# Patient Record
Sex: Male | Born: 1970 | Race: Black or African American | Hispanic: No | Marital: Single | Smoking: Current every day smoker
Health system: Southern US, Community
[De-identification: ages and names within clinical notes are randomized; demographics above are authoritative.]

## PROBLEM LIST (undated history)

## (undated) DIAGNOSIS — Z046 Encounter for general psychiatric examination, requested by authority: Secondary | ICD-10-CM

## (undated) DIAGNOSIS — L309 Dermatitis, unspecified: Secondary | ICD-10-CM

## (undated) DIAGNOSIS — M7020 Olecranon bursitis, unspecified elbow: Secondary | ICD-10-CM

## (undated) DIAGNOSIS — M1990 Unspecified osteoarthritis, unspecified site: Secondary | ICD-10-CM

## (undated) DIAGNOSIS — F1010 Alcohol abuse, uncomplicated: Secondary | ICD-10-CM

## (undated) DIAGNOSIS — Z720 Tobacco use: Secondary | ICD-10-CM

## (undated) DIAGNOSIS — E119 Type 2 diabetes mellitus without complications: Secondary | ICD-10-CM

## (undated) DIAGNOSIS — Z8659 Personal history of other mental and behavioral disorders: Secondary | ICD-10-CM

## (undated) DIAGNOSIS — T8859XA Other complications of anesthesia, initial encounter: Secondary | ICD-10-CM

## (undated) DIAGNOSIS — F1994 Other psychoactive substance use, unspecified with psychoactive substance-induced mood disorder: Secondary | ICD-10-CM

## (undated) DIAGNOSIS — I10 Essential (primary) hypertension: Secondary | ICD-10-CM

## (undated) DIAGNOSIS — K219 Gastro-esophageal reflux disease without esophagitis: Secondary | ICD-10-CM

## (undated) HISTORY — DX: Dermatitis, unspecified: L30.9

## (undated) HISTORY — PX: EXTERNAL EAR SURGERY: SHX627

## (undated) HISTORY — PX: HERNIA REPAIR: SHX51

---

## 2004-07-27 ENCOUNTER — Emergency Department: Payer: Self-pay | Admitting: Emergency Medicine

## 2004-09-25 ENCOUNTER — Emergency Department: Payer: Self-pay | Admitting: Emergency Medicine

## 2009-03-12 ENCOUNTER — Emergency Department: Payer: Self-pay | Admitting: Emergency Medicine

## 2013-07-23 ENCOUNTER — Emergency Department: Payer: Self-pay | Admitting: Emergency Medicine

## 2013-07-23 LAB — COMPREHENSIVE METABOLIC PANEL
Albumin: 4.4 g/dL (ref 3.4–5.0)
Alkaline Phosphatase: 71 U/L
BUN: 9 mg/dL (ref 7–18)
Bilirubin,Total: 0.2 mg/dL (ref 0.2–1.0)
EGFR (Non-African Amer.): >60
Osmolality: 283 (ref 275–301)
SGPT (ALT): 28 U/L (ref 12–78)
Total Protein: 8.7 g/dL — ABNORMAL HIGH (ref 6.4–8.2)

## 2013-07-23 LAB — CBC
HCT: 51 % (ref 40.0–52.0)
MCH: 35 pg — ABNORMAL HIGH (ref 26.0–34.0)
MCV: 103 fL — ABNORMAL HIGH (ref 80–100)
RBC: 4.98 10*6/uL (ref 4.40–5.90)

## 2013-07-23 LAB — ETHANOL
Ethanol %: 0.349 % (ref 0.000–0.080)
Ethanol: 349 mg/dL

## 2013-12-11 ENCOUNTER — Emergency Department: Payer: Self-pay | Admitting: Emergency Medicine

## 2013-12-11 LAB — URINALYSIS, COMPLETE
BACTERIA: NONE SEEN
BILIRUBIN, UR: NEGATIVE
BLOOD: NEGATIVE
GLUCOSE, UR: NEGATIVE mg/dL (ref 0–75)
KETONE: NEGATIVE
Nitrite: NEGATIVE
PH: 7 (ref 4.5–8.0)
Protein: NEGATIVE
SPECIFIC GRAVITY: 1.02 (ref 1.003–1.030)
Squamous Epithelial: <1
WBC UR: 14 /HPF (ref 0–5)

## 2014-05-09 ENCOUNTER — Emergency Department: Payer: Self-pay | Admitting: Emergency Medicine

## 2014-06-16 ENCOUNTER — Emergency Department: Payer: Self-pay | Admitting: Emergency Medicine

## 2014-06-27 ENCOUNTER — Emergency Department: Payer: Self-pay | Admitting: Emergency Medicine

## 2015-11-07 ENCOUNTER — Encounter: Payer: Self-pay | Admitting: Emergency Medicine

## 2015-11-07 ENCOUNTER — Emergency Department
Admission: EM | Admit: 2015-11-07 | Discharge: 2015-11-08 | Disposition: A | Discharge status: Home / Self Care | Payer: Self-pay | Attending: Emergency Medicine | Admitting: Emergency Medicine

## 2015-11-07 DIAGNOSIS — F17210 Nicotine dependence, cigarettes, uncomplicated: Secondary | ICD-10-CM | POA: Insufficient documentation

## 2015-11-07 DIAGNOSIS — R4781 Slurred speech: Secondary | ICD-10-CM | POA: Insufficient documentation

## 2015-11-07 DIAGNOSIS — F1010 Alcohol abuse, uncomplicated: Secondary | ICD-10-CM

## 2015-11-07 DIAGNOSIS — F4325 Adjustment disorder with mixed disturbance of emotions and conduct: Secondary | ICD-10-CM

## 2015-11-07 DIAGNOSIS — R45851 Suicidal ideations: Secondary | ICD-10-CM | POA: Insufficient documentation

## 2015-11-07 DIAGNOSIS — F1994 Other psychoactive substance use, unspecified with psychoactive substance-induced mood disorder: Secondary | ICD-10-CM

## 2015-11-07 LAB — COMPREHENSIVE METABOLIC PANEL
ALBUMIN: 4.2 g/dL (ref 3.5–5.0)
ALK PHOS: 50 U/L (ref 38–126)
ALT: 22 U/L (ref 17–63)
ANION GAP: 10 (ref 5–15)
AST: 36 U/L (ref 15–41)
BILIRUBIN TOTAL: 0.4 mg/dL (ref 0.3–1.2)
BUN: 10 mg/dL (ref 6–20)
CALCIUM: 8.7 mg/dL — AB (ref 8.9–10.3)
CO2: 22 mmol/L (ref 22–32)
CREATININE: 0.91 mg/dL (ref 0.61–1.24)
Chloride: 108 mmol/L (ref 101–111)
GFR calc Af Amer: >60 mL/min (ref >60)
GFR calc non Af Amer: >60 mL/min (ref >60)
GLUCOSE: 72 mg/dL (ref 65–99)
Potassium: 4 mmol/L (ref 3.5–5.1)
SODIUM: 140 mmol/L (ref 135–145)
TOTAL PROTEIN: 8 g/dL (ref 6.5–8.1)

## 2015-11-07 LAB — URINALYSIS COMPLETE WITH MICROSCOPIC (ARMC ONLY)
BILIRUBIN URINE: NEGATIVE
Glucose, UA: NEGATIVE mg/dL
Ketones, ur: NEGATIVE mg/dL
Leukocytes, UA: NEGATIVE
NITRITE: NEGATIVE
PH: 5 (ref 5.0–8.0)
PROTEIN: 30 mg/dL — AB
Specific Gravity, Urine: 1.012 (ref 1.005–1.030)

## 2015-11-07 LAB — CBC WITH DIFFERENTIAL/PLATELET
Basophils Absolute: 0 10*3/uL (ref 0–0.1)
Basophils Relative: 0 %
Eosinophils Absolute: 0 10*3/uL (ref 0–0.7)
Eosinophils Relative: 0 %
HEMATOCRIT: 47.6 % (ref 40.0–52.0)
HEMOGLOBIN: 16.3 g/dL (ref 13.0–18.0)
LYMPHS ABS: 2.2 10*3/uL (ref 1.0–3.6)
Lymphocytes Relative: 22 %
MCH: 34.3 pg — AB (ref 26.0–34.0)
MCHC: 34.3 g/dL (ref 32.0–36.0)
MCV: 100.2 fL — ABNORMAL HIGH (ref 80.0–100.0)
MONOS PCT: 6 %
Monocytes Absolute: 0.6 10*3/uL (ref 0.2–1.0)
NEUTROS ABS: 7.1 10*3/uL — AB (ref 1.4–6.5)
NEUTROS PCT: 72 %
Platelets: 240 10*3/uL (ref 150–440)
RBC: 4.75 MIL/uL (ref 4.40–5.90)
RDW: 14.2 % (ref 11.5–14.5)
WBC: 9.9 10*3/uL (ref 3.8–10.6)

## 2015-11-07 LAB — URINE DRUG SCREEN, QUALITATIVE (ARMC ONLY)
AMPHETAMINES, UR SCREEN: NOT DETECTED
BARBITURATES, UR SCREEN: NOT DETECTED
BENZODIAZEPINE, UR SCRN: NOT DETECTED
Cannabinoid 50 Ng, Ur ~~LOC~~: NOT DETECTED
Cocaine Metabolite,Ur ~~LOC~~: NOT DETECTED
MDMA (Ecstasy)Ur Screen: NOT DETECTED
METHADONE SCREEN, URINE: NOT DETECTED
OPIATE, UR SCREEN: NOT DETECTED
Phencyclidine (PCP) Ur S: NOT DETECTED
TRICYCLIC, UR SCREEN: NOT DETECTED

## 2015-11-07 LAB — ETHANOL: Alcohol, Ethyl (B): 189 mg/dL — ABNORMAL HIGH (ref <5)

## 2015-11-07 MED ORDER — LORAZEPAM 2 MG PO TABS
2.0000 mg | ORAL_TABLET | Freq: Once | ORAL | Status: DC
Start: 2015-11-07 — End: 2015-11-08

## 2015-11-07 NOTE — ED Notes (Signed)
Pt is awake in the ED this evening and states that he would like to go home today. Pt mood is irritable and he does not wish to stay over night. Writer discussed tx plan and implications of IVC commitment and [facility]. Writer also encouraged pt to remain calm while waiting for psychiatrist to evaluate him. Pt is argumentative but ultimately cooperative with staff thus far and [facility]. Food and drink provided and 15 minute checks are ongoing for safety.

## 2015-11-07 NOTE — ED Notes (Signed)
States he has a lot going on, states " i think my kids would be better off without me"

## 2015-11-07 NOTE — ED Notes (Signed)
Pt informed og his IVC status by me and [facility] security   He is verbalizing threats and states  "I will be leaving here - yall got 2 hours and then you better call security cause nothing gonna stop me from leaving here."  Security spoke with him  - pt observed sitting on the side of the bed

## 2015-11-07 NOTE — BH Assessment (Signed)
Assessment Note  Derrick Hopkins is an 45 y.o. male presenting to the ED after endorsing suicidal ideations without intent.  Pt reports that he recently found out that his spouse had been cheating.  He admits to making a vague statement about "no longer being around" but states that he did not mean that he was going to harm himself.  He states that initial meaning was that he was going to move away.  Pt denies any HI or auditory/visual hallucinations.  Pt's BAC is 189.  Diagnosis: suicidal ideations  Past Medical History: History reviewed. No pertinent past medical history.  Past Surgical History  Procedure Laterality Date  . Hernia repair      Family History: History reviewed. No pertinent family history.  Social History:  reports that he has been smoking.  He does not have any smokeless tobacco history on file. His alcohol and drug histories are not on file.  Additional Social History:  Alcohol / Drug Use History of alcohol / drug use?: No history of alcohol / drug abuse (Pt denies)  CIWA: CIWA-Ar BP: 132/60 mmHg Pulse Rate: 96 COWS:    Allergies: No Known Allergies  Home Medications:  (Not in a hospital admission)  OB/GYN Status:  No LMP for male patient.  General Assessment Data [facility] Assessment: In system Is this a Tele or Face-to-Face Assessment?: Face-to-Face Is this an Initial Assessment or a Re-assessment for this encounter?: Initial Assessment Marital status: Married [place] name: N/A Is patient pregnant?: No Pregnancy Status: No Living Arrangements: Spouse/significant other Can pt return to current living arrangement?: Yes Admission Status: Involuntary Is patient capable of signing voluntary admission?: Yes Referral Source: Self/Family/Friend Insurance type: None  Medical Screening Exam [facility] Walk-in ONLY) Medical Exam completed: Yes  Crisis Care Plan Living Arrangements: Spouse/significant other Legal Guardian: Other:  (self) Name of Psychiatrist: None reported Name of Therapist: None reported  Education Status Is patient currently in school?: No Current Grade: N/A Highest grade of school patient has completed: N/A Name of school: N/A Contact person: N/A  Risk to self with the past 6 months Suicidal Ideation: No Has patient been a risk to self within the past 6 months prior to admission? : No Suicidal Intent: No Has patient had any suicidal intent within the past 6 months prior to admission? : No Is patient at risk for suicide?: No Suicidal Plan?: No Has patient had any suicidal plan within the past 6 months prior to admission? : No Access to Means: No What has been your use of drugs/alcohol within the last 12 months?: Alcohol Previous Attempts/Gestures: No How many times?: 0 Other Self Harm Risks: None identified Triggers for Past Attempts: None known Intentional Self Injurious Behavior: None Family Suicide History: No Recent stressful life event(s): Conflict (Comment) (Conflict with spouse) Persecutory voices/beliefs?: No Depression: No Substance abuse history and/or treatment for substance abuse?: No Suicide prevention information given to non-admitted patients: Not applicable  Risk to Others within the past 6 months Homicidal Ideation: No Does patient have any lifetime risk of violence toward others beyond the six months prior to admission? : No Thoughts of Harm to Others: No Current Homicidal Intent: No Current Homicidal Plan: No Access to Homicidal Means: No Identified Victim: None identified History of harm to others?: No Assessment of Violence: None Noted Violent Behavior Description: None identified Does patient have access to weapons?: No Criminal Charges Pending?: No Does patient have a court date: No Is patient on probation?: No  Psychosis Hallucinations: None noted Delusions: None noted  Mental Status Report Appearance/Hygiene: In scrubs Eye Contact: Good Motor  Activity: Freedom of movement Speech: Argumentative, Logical/coherent Level of Consciousness: Alert, Irritable Mood: Anxious, Angry, Suspicious Affect: Anxious, Angry, Irritable, Appropriate to circumstance Anxiety Level: Minimal Thought Processes: Relevant, Coherent Judgement: Partial Orientation: Person, Place, Time, Situation Obsessive Compulsive Thoughts/Behaviors: None  Cognitive Functioning Concentration: Normal Memory: Recent Intact, Remote Intact IQ: Average Insight: Fair Impulse Control: Fair Appetite: Good Weight Loss: 0 Weight Gain: 0 Sleep: No Change Total Hours of Sleep: 8 Vegetative Symptoms: None  ADLScreening [facility] Assessment Services) Patient's cognitive ability adequate to safely complete daily activities?: Yes Patient able to express need for assistance with ADLs?: Yes Independently performs ADLs?: Yes (appropriate for developmental age)  Prior Inpatient Therapy Prior Inpatient Therapy: No Prior Therapy Dates: N/A Prior Therapy Facilty/Provider(s): N/A Reason for Treatment: N/A  Prior Outpatient Therapy Prior Outpatient Therapy: No Prior Therapy Dates: N/A Prior Therapy Facilty/Provider(s): N/A Reason for Treatment: N/A Does patient have an ACCT team?: No Does patient have Intensive In-House Services?  : No Does patient have [facility] services? : No Does patient have P4CC services?: No  ADL Screening (condition at time of admission) Patient's cognitive ability adequate to safely complete daily activities?: Yes Patient able to express need for assistance with ADLs?: Yes Independently performs ADLs?: Yes (appropriate for developmental age)       Abuse/Neglect Assessment (Assessment to be complete while patient is alone) Physical Abuse: Denies Verbal Abuse: Denies Sexual Abuse: Denies Exploitation of patient/patient's resources: Denies Self-Neglect: Denies Values / Beliefs Cultural Requests During Hospitalization: None Spiritual Requests  During Hospitalization: None Consults Spiritual Care Consult Needed: No Social Work Consult Needed: No Merchant navy officer (For Healthcare) Does patient have an advance directive?: No Would patient like information on creating an advanced directive?: Yes English as a second language teacher given    Additional Information 1:1 In Past 12 Months?: No CIRT Risk: No Elopement Risk: Yes Does patient have medical clearance?: Yes     Disposition:  Disposition Initial Assessment Completed for this Encounter: Yes Disposition of Patient: Other dispositions Other disposition(s): Other (Comment) (Psych MD consult)  On Site Evaluation by:   Reviewed with Physician:    [place] 11/07/2015 9:39 PM

## 2015-11-07 NOTE — ED Notes (Signed)

## 2015-11-07 NOTE — ED Notes (Signed)
BEHAVIORAL HEALTH ROUNDING Patient sleeping: No. Patient alert and oriented: yes Behavior appropriate: Yes.  ; If no, describe:  Nutrition and fluids offered: yes Toileting and hygiene offered: Yes  Sitter present: q15 minute observations and security  monitoring Law enforcement present: Yes  ODS  

## 2015-11-07 NOTE — ED Provider Notes (Signed)
[facility] Emergency Department Provider Note ____________________________________________  Time seen: Approximately 7:01 PM  I have reviewed the triage vital signs and the nursing notes.   HISTORY  Chief Complaint Suicidal   HPI Derrick Hopkins is a 45 y.o. male with no significant past medical history who became very upset when he found out that his girlfriend and mother of his 42-year-old had been lying to him, both cheating on him and using Suboxone. He states he had been moving her in with him over the past month and she had been intermittently staying with them. He states he does not normally drink alcohol but did drink [place] best today. He called 911 because he felt like he needed to talk to someone. The police officer responding placed him on IVC because he reported he wanted to kill his girlfriend and himself. Patient denies this to me. However his speech is slurred and I suspect he is intoxicated. 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  He denies any recent physical complaints or illness. He states he normally drinks alcohol about once a week. He denies any drug use.  History reviewed. No pertinent past medical history.  There are no active problems to display for this patient.   Past Surgical History  Procedure Laterality Date  . Hernia repair      No current outpatient prescriptions on file.  Allergies Review of patient's allergies indicates no known allergies.  History reviewed. No pertinent family history.  Social History Social History  Substance Use Topics  . Smoking status: Current Every Day Smoker  . Smokeless tobacco: None  . Alcohol Use: None    Review of Systems Constitutional: No fever Cardiovascular: Denies chest pain. Respiratory: Denies shortness of breath. Gastrointestinal: No abdominal pain.  10-point ROS otherwise  negative.  ____________________________________________   PHYSICAL EXAM:  VITAL SIGNS:  Filed Vitals:   11/07/15 2000  BP: 132/60  Pulse: 96  Temp: 97.9 F (36.6 C)  Resp: 18   Constitutional: Sleeping but aroused to voice. Well appearing and in no acute distress. Eyes: Conjunctivae are normal. PERRL. EOMI. Head: Atraumatic. Nose: No congestion/rhinnorhea. Mouth/Throat: Mucous membranes are slightly dry.  Oropharynx non-erythematous. Neck: No stridor Cardiovascular: Normal rate, regular rhythm. Grossly normal heart sounds.  Good peripheral circulation. Respiratory: Normal respiratory effort.  No retractions. Lungs CTAB. Gastrointestinal: Soft and nontender. No distention. Musculoskeletal: No lower extremity tenderness nor edema.  No joint effusions. Neurologic: Speech slightly slurred. No gross focal neurologic deficits are appreciated. No gait instability. Skin:  Skin is warm, dry and intact. No rash noted. Psychiatric: Affect is somewhat flat. Speech and behavior are normal.  ____________________________________________   LABS (all labs ordered are listed, but only abnormal results are displayed)  Labs Reviewed  CBC WITH DIFFERENTIAL/PLATELET - Abnormal; Notable for the following:    MCV 100.2 (*)    MCH 34.3 (*)    Neutro Abs 7.1 (*)    All other components within normal limits  COMPREHENSIVE METABOLIC PANEL - Abnormal; Notable for the following:    Calcium 8.7 (*)    All other components within normal limits  ETHANOL - Abnormal; Notable for the following:    Alcohol, Ethyl (B) 189 (*)    All other components within normal limits  URINALYSIS COMPLETEWITH MICROSCOPIC ([facility] ONLY) - Abnormal; Notable for the following:    Color, Urine YELLOW (*)    APPearance HAZY (*)  Hgb urine dipstick 2+ (*)    Protein, ur 30 (*)    Bacteria, UA RARE (*)    Squamous Epithelial / LPF 0-5 (*)    All other components within normal limits  URINE DRUG SCREEN, QUALITATIVE ([facility]  ONLY)   _ ____________________________________________   INITIAL IMPRESSION / ASSESSMENT AND PLAN / ED COURSE  Pertinent labs & imaging results that were available during my care of the patient were reviewed by me and considered in my medical decision making (see chart for details).  I will keep patient on IVC until he is sober and can be reevaluated. I am not clear on how forthcoming patient is with his history. He is medically cleared for psychiatric evaluation at 8:30 PM. ____________________________________________   FINAL CLINICAL IMPRESSION(S) / ED DIAGNOSES  Final diagnoses:  Suicidal ideation        Maurilio LovelyNoelle Brinlee Gambrell, MD 11/08/15 [phone]

## 2015-11-08 DIAGNOSIS — R45851 Suicidal ideations: Secondary | ICD-10-CM

## 2015-11-08 DIAGNOSIS — F1994 Other psychoactive substance use, unspecified with psychoactive substance-induced mood disorder: Secondary | ICD-10-CM

## 2015-11-08 DIAGNOSIS — F1010 Alcohol abuse, uncomplicated: Secondary | ICD-10-CM

## 2015-11-08 DIAGNOSIS — F4325 Adjustment disorder with mixed disturbance of emotions and conduct: Secondary | ICD-10-CM

## 2015-11-08 NOTE — ED Notes (Signed)
Patient with discharge orders.

## 2015-11-08 NOTE — ED Notes (Signed)
Dr. Toni Amendlapacs in to talk with and evaluate the  Patient.

## 2015-11-08 NOTE — Discharge Instructions (Signed)
Suicidal Feelings: How to Help Yourself °Suicide is the taking of one's own life. If you feel as though life is getting too tough to handle and are thinking about suicide, get help right away. To get help: °· Call your local emergency services (911 in the [place] °· Call a suicide hotline to speak with a trained counselor who understands how you are feeling. The following is a list of suicide hotlines in the [place]. For a list of hotlines in [place], visit www.suicide.org/hotlines/international/[place]-suicide-hotlines.html. °¨  1-800-273-TALK (1-800-273-8255). °¨  1-800-SUICIDE (1-800-784-2433). °¨  1-888-628-9454. This is a hotline for Spanish speakers. °¨  1-800-799-4TTY (1-800-799-4889). This is a hotline for TTY users. °¨  1-866-4-U-TREVOR (1-866-488-7386). This is a hotline for lesbian, gay, bisexual, transgender, or questioning youth. °· Contact a crisis center or a local suicide prevention center. To find a crisis center or suicide prevention center: °¨ Call your local hospital, clinic, community service organization, mental health center, social service provider, or health department. Ask for assistance in connecting to a crisis center. °¨ Visit www.suicidepreventionlifeline.org/getinvolved/locator for a list of crisis centers in the [place], or visit www.suicideprevention.[place]/thinking-about-suicide/find-a-crisis-centre for a list of centers in [place]. °· Visit the following websites: °¨  National Suicide Prevention Lifeline: www.suicidepreventionlifeline.org °¨  Hopeline: www.hopeline.com °¨  American Foundation for Suicide Prevention: www.afsp.org °¨  The Trevor Project (for lesbian, gay, bisexual, transgender, or questioning youth): www.thetrevorproject.org °HOW CAN I HELP MYSELF FEEL BETTER? °· Promise yourself that you will not do anything drastic when you have suicidal feelings. Remember, there is hope. Many people have gotten through suicidal thoughts and feelings, and you will, too. You may  have gotten through them before, and this proves that you can get through them again. °· Let family, friends, teachers, or counselors know how you are feeling. Try not to isolate yourself from those who care about you. Remember, they will want to help you. Talk with someone every day, even if you do not feel sociable. Face-to-face conversation is best. °· Call a mental health professional and see one regularly. °· Visit your primary health care provider every year. °· Eat a well-balanced diet, and space your meals so you eat regularly. °· Get plenty of rest. °· Avoid alcohol and drugs, and remove them from your home. They will only make you feel worse. °· If you are thinking of taking a lot of medicine, give your medicine to someone who can give it to you one day at a time. If you are on antidepressants and are concerned you will overdose, let your health care provider know so he or she can give you safer medicines. Ask your mental health professional about the possible side effects of any medicines you are taking. °· Remove weapons, poisons, knives, and anything else that could harm you from your home. °· Try to stick to routines. Follow a schedule every day. Put self-care on your schedule. °· Make a list of realistic goals, and cross them off when you achieve them. Accomplishments give a sense of worth. °· Wait until you are feeling better before doing the things you find difficult or unpleasant. °· Exercise if you are able. You will feel better if you exercise for even a half hour each day. °· Go out in the sun or into nature. This will help you recover from depression faster. If you have a favorite place to walk, go there. °· Do the things that have always given you pleasure. Play your favorite music, read a good book, paint a picture, play your favorite instrument, or do anything   else that takes your mind off your depression if it is safe to do. °· Keep your living space well lit. °· When you are feeling well,  write yourself a letter about tips and support that you can read when you are not feeling well. °· Remember that life's difficulties can be sorted out with help. Conditions can be treated. You can work on thoughts and strategies that serve you well. °  °This information is not intended to replace advice given to you by your health care provider. Make sure you discuss any questions you have with your health care provider. °  °Document Released: 02/15/2003 Document Revised: 09/01/2014 Document Reviewed: 12/06/2013 °Elsevier Interactive Patient Education ©2016 [employer] ° °

## 2015-11-08 NOTE — ED Notes (Signed)
Patient received breakfast tray 

## 2015-11-08 NOTE — ED Notes (Signed)
Patient alert and oriented, denies Si/Hi or avh, patient states " i was drinking yesterday and said things I did not mean, I am fine now, I need to get out of here, I have court tomorrow" Patient informed that the psychiatrist would see him today and then go from there, [facility], q 15 min checks and camera monitoring.

## 2015-11-08 NOTE — Consult Note (Signed)
[facility] Consult   Reason for Consult:  45 year old man brought in by law enforcement after he called 911 and apparently made some statements that were either suicidal or worth thought to be suicidal Referring Physician:  Marcelene Butte Patient Identification: Derrick Hopkins MRN:  [id number] Principal Diagnosis: Substance induced mood disorder ([place]) Diagnosis:   Patient Active Problem List   Diagnosis Date Noted  . Substance induced mood disorder ([place]) [F19.94] 11/08/2015  . Adjustment disorder with mixed disturbance of emotions and conduct [F43.25] 11/08/2015  . Alcohol abuse [F10.10] 11/08/2015  . Suicidal ideation [R45.851] 11/08/2015    Total Time spent with patient: 1 hour  Subjective:   Derrick Hopkins is a 45 y.o. male patient admitted with "it was just the alcohol talking".  HPI:  Patient tells me essentially the same history that he told to the intake workers. He recently found out that his long-term girlfriend has been cheating on him. This is on top of multiple other frustrations he has had with her including her substance abuse problems. He says that yesterday he got together with a friend of his who had some alcohol and started drinking. He says he drank quite a bit of liquor and beer and it was much more than he normally drinks. He was feeling sad down and bad for himself and called 911 because he "wanted to talk to somebody". Apparently he made some statements to them about "taking myself out of the picture". He has indicated that he mostly was thinking about moving out of town but admits that he may have also set her thought something about hurting himself although he didn't act on it. There was a report that he had made statements about hurting or killing the girlfriend as well but he completely denies any of that now. Patient says prior to yesterday his mood is been pretty stable. Doesn't feel depressed. Sleeps well and eats well. Hasn't had any suicidal thoughts.  Denies psychotic symptoms. He minimizes his alcohol use and says that it was unusual for him to drink as much as he did yesterday and he doesn't normally drink much at all. Denies any other drug abuse. Patient has multiple positive things in his life including several children that he is partially responsible for and a job that he seems to value quite a bit. He is requesting release from the emergency room.  Social history: Patient lives by himself. He has 5 young children of whom he has partial custody or at least gets to visit with them every weekend. He says his relationship with them is the most important thing to him. 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 He says that's really important to him he enjoys his work and he needs to get back to work as soon as possible. Closest other relative is his mother  Medical history: Denies any significant medical problems. Denies high blood pressure heart disease diabetes.  Substance abuse history: As noted above he currently minimizes his drinking and is somewhat vague about it but claims that yesterday was very unusual for the amount that he was drinking. He is presented here once to the emergency room in the past with a blood alcohol level over 300. When I reminded him that he says that was also a very unusual situation and he never drinks anything like that. Denies any history of seizures or delirium tremens.  Says he's never been in any substance abuse treatment.  Past Psychiatric History: Denies any history of suicide attempts or violence. Never been on any psychiatric medicine. Never been admitted to a psychiatric hospital. Never seen anybody for outpatient psychiatric or mental health treatment in the past.  Risk to Self: Suicidal Ideation: No Suicidal Intent: No Is patient at risk for suicide?: No Suicidal Plan?: No Access to Means: No What has  been your use of drugs/alcohol within the last 12 months?: Alcohol How many times?: 0 Other Self Harm Risks: None identified Triggers for Past Attempts: None known Intentional Self Injurious Behavior: None Risk to Others: Homicidal Ideation: No Thoughts of Harm to Others: No Current Homicidal Intent: No Current Homicidal Plan: No Access to Homicidal Means: No Identified Victim: None identified History of harm to others?: No Assessment of Violence: None Noted Violent Behavior Description: None identified Does patient have access to weapons?: No Criminal Charges Pending?: No Does patient have a court date: No Prior Inpatient Therapy: Prior Inpatient Therapy: No Prior Therapy Dates: N/A Prior Therapy Facilty/Provider(s): N/A Reason for Treatment: N/A Prior Outpatient Therapy: Prior Outpatient Therapy: No Prior Therapy Dates: N/A Prior Therapy Facilty/Provider(s): N/A Reason for Treatment: N/A Does patient have an ACCT team?: No Does patient have Intensive In-House Services?  : No Does patient have [facility] services? : No Does patient have P4CC services?: No  Past Medical History: History reviewed. No pertinent past medical history.  Past Surgical History  Procedure Laterality Date  . Hernia repair     Family History: History reviewed. No pertinent family history. Family Psychiatric  History: Patient says that there is no family history of mental health problems whatsoever. Social History:  History  Alcohol Use: Not on file     History  Drug Use Not on file    Social History   Social History  . Marital Status: Single    Spouse Name: N/A  . Number of Children: N/A  . Years of Education: N/A   Social History Main Topics  . Smoking status: Current Every Day Smoker  . Smokeless tobacco: None  . Alcohol Use: None  . Drug Use: None  . Sexual Activity: Not Asked   Other Topics Concern  . None   Social History Narrative  . None   Additional Social History:     Allergies:  No Known Allergies  Labs:  Results for orders placed or performed during the hospital encounter of 11/07/15 (from the past 48 hour(s))  CBC with Differential     Status: Abnormal   Collection Time: 11/07/15  5:08 PM  Result Value Ref Range   WBC 9.9 3.8 - 10.6 K/uL   RBC 4.75 4.40 - 5.90 MIL/uL   Hemoglobin 16.3 13.0 - 18.0 g/dL   HCT 47.6 40.0 - 52.0 %   MCV 100.2 (H) 80.0 - 100.0 fL   MCH 34.3 (H) 26.0 - 34.0 pg   MCHC 34.3 32.0 - 36.0 g/dL   RDW 14.2 11.5 - 14.5 %   Platelets 240 150 - 440 K/uL   Neutrophils Relative % 72 %   Neutro Abs 7.1 (H) 1.4 - 6.5 K/uL   Lymphocytes Relative 22 %   Lymphs Abs 2.2 1.0 - 3.6 K/uL   Monocytes Relative 6 %   Monocytes Absolute 0.6 0.2 - 1.0 K/uL   Eosinophils Relative 0 %   Eosinophils Absolute 0.0 0 - 0.7 K/uL   Basophils Relative 0 %   Basophils Absolute 0.0 0 - 0.1 K/uL  Comprehensive metabolic panel     Status: Abnormal   Collection Time: 11/07/15  5:08 PM  Result Value Ref Range   Sodium 140 135 - 145 mmol/L   Potassium 4.0 3.5 - 5.1 mmol/L   Chloride 108 101 - 111 mmol/L   CO2 22 22 - 32 mmol/L   Glucose, Bld 72 65 - 99 mg/dL   BUN 10 6 - 20 mg/dL   Creatinine, Ser 0.91 0.61 - 1.24 mg/dL   Calcium 8.7 (L) 8.9 - 10.3 mg/dL   Total Protein 8.0 6.5 - 8.1 g/dL   Albumin 4.2 3.5 - 5.0 g/dL   AST 36 15 - 41 U/L   ALT 22 17 - 63 U/L   Alkaline Phosphatase 50 38 - 126 U/L   Total Bilirubin 0.4 0.3 - 1.2 mg/dL   GFR calc non Af Amer >60 >60 mL/min   GFR calc Af Amer >60 >60 mL/min    Comment: (NOTE) The eGFR has been calculated using the CKD EPI equation. This calculation has not been validated in all clinical situations. eGFR's persistently <60 mL/min signify possible Chronic Kidney Disease.    Anion gap 10 5 - 15  Ethanol     Status: Abnormal   Collection Time: 11/07/15  5:08 PM  Result Value Ref Range   Alcohol, Ethyl (B) 189 (H) <5 mg/dL    Comment:        LOWEST DETECTABLE LIMIT FOR SERUM ALCOHOL IS 5  mg/dL FOR MEDICAL PURPOSES ONLY   Urinalysis complete, with microscopic ([facility] only)     Status: Abnormal   Collection Time: 11/07/15  5:08 PM  Result Value Ref Range   Color, Urine YELLOW (A) YELLOW   APPearance HAZY (A) CLEAR   Glucose, UA NEGATIVE NEGATIVE mg/dL   Bilirubin Urine NEGATIVE NEGATIVE   Ketones, ur NEGATIVE NEGATIVE mg/dL   Specific Gravity, Urine 1.012 1.005 - 1.030   Hgb urine dipstick 2+ (A) NEGATIVE   pH 5.0 5.0 - 8.0   Protein, ur 30 (A) NEGATIVE mg/dL   Nitrite NEGATIVE NEGATIVE   Leukocytes, UA NEGATIVE NEGATIVE   RBC / HPF 0-5 0 - 5 RBC/hpf   WBC, UA 6-30 0 - 5 WBC/hpf   Bacteria, UA RARE (A) NONE SEEN   Squamous Epithelial / LPF 0-5 (A) NONE SEEN   Mucous PRESENT    Hyaline Casts, UA PRESENT    Amorphous Crystal PRESENT   Urine Drug Screen, Qualitative ([facility] only)     Status: None   Collection Time: 11/07/15  5:08 PM  Result Value Ref Range   Tricyclic, Ur Screen NONE DETECTED NONE DETECTED   Amphetamines, Ur Screen NONE DETECTED NONE DETECTED   MDMA (Ecstasy)Ur Screen NONE DETECTED NONE DETECTED   Cocaine Metabolite,Ur [place] NONE DETECTED NONE DETECTED   Opiate, Ur Screen NONE DETECTED NONE DETECTED   Phencyclidine (PCP) Ur S NONE DETECTED NONE DETECTED   Cannabinoid 50 Ng, Ur  NONE DETECTED NONE DETECTED   Barbiturates, Ur Screen NONE DETECTED NONE DETECTED   Benzodiazepine, Ur Scrn NONE DETECTED NONE DETECTED   Methadone Scn, Ur NONE DETECTED NONE DETECTED    Comment: (NOTE) 263  Tricyclics, urine               Cutoff 1000 ng/mL 200  Amphetamines, urine             Cutoff 1000 ng/mL 300  MDMA (Ecstasy), urine           Cutoff 500 ng/mL 400  Cocaine  Metabolite, urine       Cutoff 300 ng/mL 500  Opiate, urine                   Cutoff 300 ng/mL 600  Phencyclidine (PCP), urine      Cutoff 25 ng/mL 700  Cannabinoid, urine              Cutoff 50 ng/mL 800  Barbiturates, urine             Cutoff 200 ng/mL 900  Benzodiazepine, urine           Cutoff 200  ng/mL 1000 Methadone, urine                Cutoff 300 ng/mL 1100 1200 The urine drug screen provides only a preliminary, unconfirmed 1300 analytical test result and should not be used for non-medical 1400 purposes. Clinical consideration and professional judgment should 1500 be applied to any positive drug screen result due to possible 1600 interfering substances. A more specific alternate chemical method 1700 must be used in order to obtain a confirmed analytical result.  1800 Gas chromato graphy / mass spectrometry (GC/MS) is the preferred 1900 confirmatory method.     Current Facility-Administered Medications  Medication Dose Route Frequency Provider Last Rate Last Dose  . LORazepam (ATIVAN) tablet 2 mg  2 mg Oral Once Ponciano Ort, MD   2 mg at 11/08/15 3151   No current outpatient prescriptions on file.    Musculoskeletal: Strength & Muscle Tone: within normal limits Gait & Station: normal Patient leans: N/A  Psychiatric Specialty Exam: Review of Systems  Constitutional: Negative.   HENT: Negative.   Eyes: Negative.   Respiratory: Negative.   Cardiovascular: Negative.   Gastrointestinal: Negative.   Musculoskeletal: Negative.   Skin: Negative.   Neurological: Negative.   Psychiatric/Behavioral: Positive for substance abuse. Negative for depression, suicidal ideas, hallucinations and memory loss. The patient does not have insomnia.     Blood pressure 124/70, pulse 78, temperature 99 F (37.2 C), temperature source Oral, resp. rate 20, SpO2 100 %.There is no height or weight on file to calculate BMI.  General Appearance: Disheveled and Rather malodorous as well  Eye Contact::  Good  Speech:  Slow  Volume:  Normal  Mood:  Euthymic  Affect:  Appropriate  Thought Process:  Coherent  Orientation:  Full (Time, Place, and Person)  Thought Content:  Negative  Suicidal Thoughts:  No  Homicidal Thoughts:  No  Memory:  Immediate;   Good Recent;   Fair Remote;   Fair   Judgement:  Fair  Insight:  Fair  Psychomotor Activity:  Decreased  Concentration:  Fair  Recall:  [employer] of Knowledge:Fair  Language: Fair  Akathisia:  Negative  Handed:  Right  AIMS (if indicated):     Assets:  Agricultural consultant Housing Physical Health Resilience Social Support  ADL's:  Intact  Cognition: WNL  Sleep:      Treatment Plan Summary: Plan 45 year old man presented intoxicated last night blood alcohol level almost 200. Probably significantly higher earlier. Had made some statements about possibly being suicidal but had not actually acted on anything. No past history of suicide attempts. Consistently denying suicidal or homicidal ideation now. Patient does not require inpatient level treatment. He is minimizing his alcohol abuse but he has been counseled about the dangers of alcohol particularly as it obviously affects his mood and strongly encouraged to curtail his drinking. He will give an outpatient  information for referral to [facility]. Otherwise no medication indicated. Supportive counseling and education done. He can be discharged at the discretion of the emergency room doctor. IVC discontinued.  Disposition: Patient does not meet criteria for psychiatric inpatient admission.  Alethia Berthold, MD 11/08/2015 12:58 PM

## 2015-11-08 NOTE — ED Notes (Signed)
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 Patient is anxious about leaving, nurse told him that He had to wait for the Dr. , after talking to him a decision would be made and for him not to stress so much that stress would not make anything happen any faster and to try to focus on maybe a plan not to drink to prevent him from becoming so depressed again. Patient agreed, denies Si/hi at this time.

## 2015-11-08 NOTE — ED Notes (Signed)
Patient with discharge instructions, patient voices understanding, patient given all of His belongings, Patient states He is going back home and He is going to try to curb His drinking. Patient is oriented, no s/s of distress.

## 2015-11-08 NOTE — ED Provider Notes (Addendum)
-----------------------------------------   7:28 AM on 11/08/2015 -----------------------------------------   Blood pressure 132/60, pulse 96, temperature 97.9 F (36.6 C), temperature source Oral, resp. rate 18, SpO2 99 %.  The patient had no acute events since last update.  Calm and cooperative at this time.  Disposition is pending per Psychiatry/Behavioral Medicine team recommendations.  Patient was seen by psychiatry and felt stable for discharge. Patient's exhibited some mild hypertension here which may be some withdrawal-type symptoms. He does not appear to be in acute severe alcohol withdrawal. He is able to converse and able to answer questions appropriately. He is not hallucinogenic  Jennye MoccasinBrian S Quigley, MD 11/08/15 [id number]  Jennye MoccasinBrian S Quigley, MD 11/08/15 1309  Jennye MoccasinBrian S Quigley, MD 11/08/15 1309

## 2016-02-29 ENCOUNTER — Emergency Department
Admission: EM | Admit: 2016-02-29 | Discharge: 2016-03-01 | Disposition: A | Discharge status: Home / Self Care | Payer: Self-pay | Attending: Emergency Medicine | Admitting: Emergency Medicine

## 2016-02-29 ENCOUNTER — Encounter: Payer: Self-pay | Admitting: Emergency Medicine

## 2016-02-29 DIAGNOSIS — S0081XA Abrasion of other part of head, initial encounter: Secondary | ICD-10-CM | POA: Insufficient documentation

## 2016-02-29 DIAGNOSIS — R45851 Suicidal ideations: Secondary | ICD-10-CM

## 2016-02-29 DIAGNOSIS — Y92009 Unspecified place in unspecified non-institutional (private) residence as the place of occurrence of the external cause: Secondary | ICD-10-CM | POA: Insufficient documentation

## 2016-02-29 DIAGNOSIS — F17200 Nicotine dependence, unspecified, uncomplicated: Secondary | ICD-10-CM | POA: Insufficient documentation

## 2016-02-29 DIAGNOSIS — Y999 Unspecified external cause status: Secondary | ICD-10-CM | POA: Insufficient documentation

## 2016-02-29 DIAGNOSIS — Z046 Encounter for general psychiatric examination, requested by authority: Secondary | ICD-10-CM

## 2016-02-29 DIAGNOSIS — W2209XA Striking against other stationary object, initial encounter: Secondary | ICD-10-CM | POA: Insufficient documentation

## 2016-02-29 DIAGNOSIS — F4325 Adjustment disorder with mixed disturbance of emotions and conduct: Secondary | ICD-10-CM | POA: Insufficient documentation

## 2016-02-29 DIAGNOSIS — Z5181 Encounter for therapeutic drug level monitoring: Secondary | ICD-10-CM | POA: Insufficient documentation

## 2016-02-29 DIAGNOSIS — Y9389 Activity, other specified: Secondary | ICD-10-CM | POA: Insufficient documentation

## 2016-02-29 DIAGNOSIS — F1010 Alcohol abuse, uncomplicated: Secondary | ICD-10-CM | POA: Insufficient documentation

## 2016-02-29 DIAGNOSIS — F1994 Other psychoactive substance use, unspecified with psychoactive substance-induced mood disorder: Secondary | ICD-10-CM | POA: Insufficient documentation

## 2016-02-29 LAB — ETHANOL: Alcohol, Ethyl (B): 72 mg/dL — ABNORMAL HIGH (ref <5)

## 2016-02-29 LAB — COMPREHENSIVE METABOLIC PANEL
ALBUMIN: 4 g/dL (ref 3.5–5.0)
ALT: 20 U/L (ref 17–63)
AST: 27 U/L (ref 15–41)
Alkaline Phosphatase: 49 U/L (ref 38–126)
Anion gap: 7 (ref 5–15)
BUN: 7 mg/dL (ref 6–20)
CHLORIDE: 109 mmol/L (ref 101–111)
CO2: 24 mmol/L (ref 22–32)
CREATININE: 0.91 mg/dL (ref 0.61–1.24)
Calcium: 8.9 mg/dL (ref 8.9–10.3)
GFR calc non Af Amer: >60 mL/min (ref >60)
Glucose, Bld: 106 mg/dL — ABNORMAL HIGH (ref 65–99)
Potassium: 3.8 mmol/L (ref 3.5–5.1)
SODIUM: 140 mmol/L (ref 135–145)
Total Bilirubin: 0.6 mg/dL (ref 0.3–1.2)
Total Protein: 7.3 g/dL (ref 6.5–8.1)

## 2016-02-29 LAB — CBC
HCT: 47.2 % (ref 40.0–52.0)
HEMOGLOBIN: 16.6 g/dL (ref 13.0–18.0)
MCH: 35.9 pg — AB (ref 26.0–34.0)
MCHC: 35 g/dL (ref 32.0–36.0)
MCV: 102.4 fL — ABNORMAL HIGH (ref 80.0–100.0)
Platelets: 205 10*3/uL (ref 150–440)
RBC: 4.61 MIL/uL (ref 4.40–5.90)
RDW: 13.5 % (ref 11.5–14.5)
WBC: 6.8 10*3/uL (ref 3.8–10.6)

## 2016-02-29 LAB — URINE DRUG SCREEN, QUALITATIVE (ARMC ONLY)
Amphetamines, Ur Screen: NOT DETECTED
Barbiturates, Ur Screen: NOT DETECTED
Benzodiazepine, Ur Scrn: NOT DETECTED
CANNABINOID 50 NG, UR ~~LOC~~: NOT DETECTED
COCAINE METABOLITE, UR ~~LOC~~: NOT DETECTED
MDMA (ECSTASY) UR SCREEN: NOT DETECTED
METHADONE SCREEN, URINE: NOT DETECTED
Opiate, Ur Screen: NOT DETECTED
Phencyclidine (PCP) Ur S: NOT DETECTED
TRICYCLIC, UR SCREEN: NOT DETECTED

## 2016-02-29 LAB — ACETAMINOPHEN LEVEL: Acetaminophen (Tylenol), Serum: <10 ug/mL — ABNORMAL LOW (ref 10–30)

## 2016-02-29 LAB — SALICYLATE LEVEL

## 2016-02-29 NOTE — ED Notes (Signed)
Pt is very talkative   Rambling non stop  Pacing in room but remains in room with encouragement.

## 2016-02-29 NOTE — ED Provider Notes (Signed)
-----------------------------------------   4:29 PM on 02/29/2016 -----------------------------------------   Blood pressure 144/85, pulse 52, temperature 99 F (37.2 C), temperature source Oral, height 6' (1.829 m), weight 252 lb (114.306 kg), SpO2 100 %.  Assuming care from Dr. Inocencio HomesGayle.  In short, Derrick Hopkins is a 45 y.o. male with a chief complaint of Facial Laceration .  Refer to the original H&P for additional details.  The current plan of care is to involuntarily commit the patient and arrange for psychiatric and TTS evaluation. Patient's apparently stated suicidal and homicidal type statements. Patient's currently being medically cleared   Jennye MoccasinBrian S Quigley, MD 02/29/16 1630

## 2016-02-29 NOTE — ED Notes (Signed)
See triage note  Superficial abrasion noted to forehead. Tells this nurse and PA that he is not able to be in society wants to hurt self

## 2016-02-29 NOTE — ED Notes (Signed)

## 2016-02-29 NOTE — ED Notes (Signed)
Pt is moved to room 22 per charge nurse

## 2016-02-29 NOTE — ED Notes (Signed)
BEHAVIORAL HEALTH ROUNDING Patient sleeping: No. Patient alert and oriented: yes Behavior appropriate: Yes.  ; If no, describe:  Nutrition and fluids offered: yes Toileting and hygiene offered: Yes  Sitter present: q15 minute observations and security  monitoring Law enforcement present: Yes  ODS  

## 2016-02-29 NOTE — ED Notes (Addendum)
Patient head butted plastic screen door on way out of door while being arrested. Has small abrasion to forehead. Patient here for medical clearance. Patient in [place] of [place] [employer] [place].

## 2016-02-29 NOTE — ED Provider Notes (Signed)
[facility] Emergency Department Provider Note  ____________________________________________  Time seen: Approximately 12:13 PM  I have reviewed the triage vital signs and the nursing notes.   HISTORY  Chief Complaint Facial Laceration    HPI Derrick Hopkins is a 45 y.o. male presents for evaluation for an abrasion to his forehead. Patient was in the process of being arrested by local police department when he had butted the screen door is when out of the house. Upon arrival he denies any visual changes. Denies any headache. He does complain of being suicidal being a threat to himself into society does not want to live anymore and desires to see evaluated by a psychiatrist due to his current condition. Describes pain as minimal keeps repeating his desire for suicidal ideation. Patient states that he drank a 40 ounce beer approximately 5:00 this morning.   History reviewed. No pertinent past medical history.  Patient Active Problem List   Diagnosis Date Noted  . Substance induced mood disorder ([facility]) 11/08/2015  . 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 Alcohol abuse 11/08/2015  . Suicidal ideation 11/08/2015    Past Surgical History  Procedure Laterality Date  . Hernia repair      No current outpatient prescriptions on file.  Allergies Review of patient's allergies indicates no known allergies.  No family history on file.  Social History Social History  Substance Use Topics  . Smoking status: Current Every Day Smoker  . Smokeless tobacco: None  . Alcohol Use: None    Review of Systems Constitutional: No fever/chills Eyes: No visual changes. Musculoskeletal: Negative for back pain. Skin: Positive for superficial abrasion to forehead. Neurological: Negative for headaches, focal weakness or numbness.  10-point ROS otherwise negative.  ____________________________________________   PHYSICAL  EXAM:  VITAL SIGNS: ED Triage Vitals  Enc Vitals Group     BP 02/29/16 1207 144/85 mmHg     Pulse Rate 02/29/16 1207 52     Resp --      Temp 02/29/16 1207 99 F (37.2 C)     Temp Source 02/29/16 1207 Oral     SpO2 02/29/16 1207 100 %     Weight 02/29/16 1207 252 lb (114.306 kg)     Height 02/29/16 1207 6' (1.829 m)     Head Cir --      Peak Flow --      Pain Score --      Pain Loc --      Pain Edu? --      Excl. in GC? --     Constitutional: Alert and oriented. Well appearing and in no acute distress. Patient answers all questions appropriately. Patient feeds repetitively that he is a threat side and to himself and doesn't feel safe anymore and desires to be evaluated by psychiatry. Eyes: Conjunctivae are normal. PERRL. EOMI. Head: Atraumatic. Full range of motion. Superficial abrasions noted to the center of the forehead. Nose: No congestion/rhinnorhea. Mouth/Throat: Mucous membranes are moist.  Oropharynx non-erythematous. Neck: No stridor.  Full range of motion nontender, supple. Musculoskeletal: No lower extremity tenderness nor edema.  No joint effusions. Neurologic:  Normal speech and language. No gross focal neurologic deficits are appreciated. No gait instability. Skin:  Skin is warm, dry and intact. No rash noted. Patient does have a superficial abrasion noted to the center of his forehead. Psychiatric: Mood and affect are normal. Speech and behavior are normal.  ____________________________________________   LABS (all labs ordered are  listed, but only abnormal results are displayed)  Labs Reviewed - No data to display   PROCEDURES  Procedure(s) performed: None  Critical Care performed: No  ____________________________________________   INITIAL IMPRESSION / ASSESSMENT AND PLAN / ED COURSE  Pertinent labs & imaging results that were available during my care of the patient were reviewed by me and considered in my medical decision making (see chart for  details).  Initial impression is a superficial abrasion to the service forehead. Addition patient states that desires further evaluation for psychiatric workup and will transfer the patient over to the main ER for further evaluation and continuity of care. ____________________________________________   FINAL CLINICAL IMPRESSION(S) / ED DIAGNOSES  Final diagnoses:  Forehead abrasion, initial encounter     This chart was dictated using voice recognition software/Dragon. Despite best efforts to proofread, errors can occur which can change the meaning. Any change was purely unintentional.   Evangeline Dakinharles M Axavier Pressley, PA-C 02/29/16 1222  Gayla DossEryka A Gayle, MD 02/29/16 1550
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
## 2016-02-29 NOTE — ED Notes (Signed)
Pt was dressed out by [employer] [facility] [facility]  But refuses to have blood drawn or obtain urine spec.   Dorinda Hillonald ,charge nurse aware of change in pt status.

## 2016-02-29 NOTE — ED Notes (Signed)
BEHAVIORAL HEALTH ROUNDING Patient sleeping: Yes.   Patient alert and oriented: eyes closed  Appears to be asleep Behavior appropriate: Yes.  ; If no, describe:  Nutrition and fluids offered: Yes  Toileting and hygiene offered: sleeping Sitter present: q 15 minute observations and security monitoring Law enforcement present: yes  ODS 

## 2016-03-01 ENCOUNTER — Encounter: Payer: Self-pay | Admitting: Psychiatry

## 2016-03-01 DIAGNOSIS — F4325 Adjustment disorder with mixed disturbance of emotions and conduct: Secondary | ICD-10-CM

## 2016-03-01 DIAGNOSIS — F17200 Nicotine dependence, unspecified, uncomplicated: Secondary | ICD-10-CM | POA: Diagnosis present

## 2016-03-01 DIAGNOSIS — Z046 Encounter for general psychiatric examination, requested by authority: Secondary | ICD-10-CM

## 2016-03-01 NOTE — Discharge Instructions (Signed)
Alcohol Abuse and Nutrition Alcohol abuse is any pattern of alcohol consumption that harms your health, relationships, or work. Alcohol abuse can affect how your body breaks down and absorbs nutrients from food by causing your liver to work abnormally. Additionally, many people who abuse alcohol do not eat enough carbohydrates, protein, fat, vitamins, and minerals. This can cause poor nutrition (malnutrition) and a lack of nutrients (nutrient deficiencies), which can lead to further complications. Nutrients that are commonly lacking (deficient) among people who abuse alcohol include:  Vitamins.  Vitamin A. This is stored in your liver. It is important for your vision, metabolism, and ability to fight off infections (immunity).  B vitamins. These include vitamins such as folate, thiamin, and niacin. These are important in new cell growth and maintenance.  Vitamin C. This plays an important role in iron absorption, wound healing, and immunity.  Vitamin D. This is produced by your liver, but you can also get vitamin D from food. Vitamin D is necessary for your body to absorb and use calcium.  Minerals.  Calcium. This is important for your bones and your heart and blood vessel (cardiovascular) function.  Iron. This is important for blood, muscle, and nervous system functioning.  Magnesium. This plays an important role in muscle and nerve function, and it helps to control blood sugar and blood pressure.  Zinc. This is important for the normal function of your nervous system and digestive system (gastrointestinal tract). Nutrition is an essential component of therapy for alcohol abuse. Your health care provider or dietitian will work with you to design a plan that can help restore nutrients to your body and prevent potential complications. WHAT IS MY PLAN? Your dietitian may develop a specific diet plan that is based on your condition and any other complications you may have. A diet plan will  commonly include:  A balanced diet.  Grains: 6-8 oz per day.  Vegetables: 2-3 cups per day.  Fruits: 1-2 cups per day.  Meat and other protein: 5-6 oz per day.  Dairy: 2-3 cups per day.  Vitamin and mineral supplements. WHAT DO I NEED TO KNOW ABOUT ALCOHOL AND NUTRITION?  Consume foods that are high in antioxidants, such as grapes, berries, nuts, green tea, and dark green and orange vegetables. This can help to counteract some of the stress that is placed on your liver by consuming alcohol.  Avoid food and drinks that are high in fat and sugar. Foods such as sugared soft drinks, salty snack foods, and candy contain empty calories. This means that they lack important nutrients such as protein, fiber, and vitamins.  Eat frequent meals and snacks. Try to eat 5-6 small meals each day.  Eat a variety of fresh fruits and vegetables each day. This will help you get plenty of water, fiber, and vitamins in your diet.  Drink plenty of water and other clear fluids. Try to drink at least 48-64 oz (1.5-2 L) of water per day.  If you are a vegetarian, eat a variety of protein-rich foods. Pair whole grains with plant-based proteins at meals and snacks to obtain the greatest nutrient benefit from your food. For example, eat rice with beans, put peanut butter on whole-grain toast, or eat oatmeal with sunflower seeds.  Soak beans and whole grains overnight before cooking. This can help your body to absorb the nutrients more easily.  Include foods fortified with vitamins and minerals in your diet. Commonly fortified foods include milk, orange juice, cereal, and bread.  If you  are malnourished, your dietitian may recommend a high-protein, high-calorie diet. This may include: °¨ 2,000-3,000 calories (kilocalories) per day. °¨ 70-100 grams of protein per day. °· Your health care provider may recommend a complete nutritional supplement beverage. This can help to restore calories, protein, and vitamins to  your body. Depending on your condition, you may be advised to consume this instead of or in addition to meals. °· Limit your intake of caffeine. Replace drinks like coffee and black tea with decaffeinated coffee and herbal tea. °· Eat a variety of foods that are high in omega fatty acids. These include fish, nuts and seeds, and soybeans. These foods may help your liver to recover and may also stabilize your mood. °· Certain medicines may cause changes in your appetite, taste, and weight. Work with your health care provider and dietitian to make any adjustments to your medicines and diet plan. °· Include other healthy lifestyle choices in your daily routine. °¨ Be physically active. °¨ Get enough sleep. °¨ Spend time doing activities that you enjoy. °· If you are unable to take in enough food and calories by mouth, your health care provider may recommend a feeding tube. This is a tube that passes through your nose and throat, directly into your stomach. Nutritional supplement beverages can be given to you through the feeding tube to help you get the nutrients you need. °· Take vitamin or mineral supplements as recommended by your health care provider. °WHAT FOODS CAN I EAT? °Grains °Enriched pasta. Enriched rice. Fortified whole-grain bread. Fortified whole-grain cereal. Barley. Brown rice. Quinoa. Millet. °Vegetables °All fresh, frozen, and canned vegetables. Spinach. Kale. Artichoke. Carrots. Winter squash and pumpkin. Sweet potatoes. Broccoli. Cabbage. Cucumbers. Tomatoes. Sweet peppers. Green beans. Peas. Corn. °Fruits °All fresh and frozen fruits. Berries. Grapes. Mango. Papaya. Guava. Cherries. Apples. Bananas. Peaches. Plums. Pineapple. Watermelon. Cantaloupe. Oranges. Avocado. °Meats and Other Protein Sources °Beef liver. Lean beef. Pork. Fresh and canned chicken. Fresh fish. Oysters. Sardines. Canned tuna. Shrimp. Eggs with yolks. Nuts and seeds. Peanut butter. Beans and lentils. Soybeans.  Tofu. °Dairy °Whole, low-fat, and nonfat milk. Whole, low-fat, and nonfat yogurt. Cottage cheese. Sour cream. Hard and soft cheeses. °Beverages °Water. Herbal tea. Decaffeinated coffee. Decaffeinated green tea. 100% fruit juice. 100% vegetable juice. Instant breakfast shakes. °Condiments °Ketchup. Mayonnaise. Mustard. Salad dressing. Barbecue sauce. °Sweets and Desserts °Sugar-free ice cream. Sugar-free pudding. Sugar-free gelatin. °Fats and Oils °Butter. Vegetable oil, flaxseed oil, olive oil, and walnut oil. °Other °Complete nutrition shakes. Protein bars. Sugar-free gum. °The items listed above may not be a complete list of recommended foods or beverages. Contact your dietitian for more options. °WHAT FOODS ARE NOT RECOMMENDED? °Grains °Sugar-sweetened breakfast cereals. Flavored instant oatmeal. Fried breads. °Vegetables °Breaded or deep-fried vegetables. °Fruits °Dried fruit with added sugar. Candied fruit. Canned fruit in syrup. °Meats and Other Protein Sources °Breaded or deep-fried meats. °Dairy °Flavored milks. Fried cheese curds or fried cheese sticks. °Beverages °Alcohol. Sugar-sweetened soft drinks. Sugar-sweetened tea. Caffeinated coffee and tea. °Condiments °Sugar. Honey. Agave nectar. Molasses. °Sweets and Desserts °Chocolate. Cake. Cookies. Candy. °Other °Potato chips. Pretzels. Salted nuts. Candied nuts. °The items listed above may not be a complete list of foods and beverages to avoid. Contact your dietitian for more information. °  °This information is not intended to replace advice given to you by your health care provider. Make sure you discuss any questions you have with your health care provider. °  °Document Released: 06/05/2005 Document Revised: 09/01/2014 Document Reviewed: 03/14/2014 °[employer]vier Interactive Patient   Education 2016 [employer]   Please return for any further problems follow-up with AA also follow with people that the psychiatrist gave you.

## 2016-03-01 NOTE — Progress Notes (Signed)
LCSW met with patient. He stated he is not homicidal or suicidal. He is agreeable to go to [facility] and reports that he needs a therapist and perhaps his Crack addicted girlfriend would benefit from the [place] [facility].  He was provided a [place]. This worker was thanked No further needs at this time.  [employer] [phone]

## 2016-03-01 NOTE — Consult Note (Signed)
  PLAN: 1. The patient does not meet criteria for psychiatric hospitalization. Please discharge as appropriate.  2. I will terminate proceedings.  3. No medications recommended.  4. He minimizes drinking. Information on substance abuse program in the area provided.  5. Full note to follow.
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
## 2016-03-01 NOTE — ED Notes (Signed)
Pt will be discharged home today. Pt accepting. Maintained on 15 minute checks and observation by security camera for safety.

## 2016-03-01 NOTE — ED Notes (Signed)
Pt denies SI/HI and AVH. All belongings returned to pt.
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
## 2016-03-01 NOTE — ED Provider Notes (Signed)
-----------------------------------------   6:47 AM on 03/01/2016 -----------------------------------------   Blood pressure 148/99, pulse 94, temperature 98.4 F (36.9 C), temperature source Oral, resp. rate 18, height 6' (1.829 m), weight 252 lb (114.306 kg), SpO2 96 %.  The patient had no acute events since last update.  Calm and cooperative at this time.  Disposition is pending per Psychiatry/Behavioral Medicine team recommendations.     Irean HongJade J Sung, MD 03/01/16 [phone]

## 2016-03-01 NOTE — ED Notes (Signed)

## 2016-03-01 NOTE — ED Notes (Signed)
Discharge paperwork reviewed with patient. All belongings will be returned to pt upon discharge. Pt denies SI/HI and AVH.  Pt dressing for discharge.  Maintained on 15 minute checks and observation by security camera for safety.
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
## 2016-03-01 NOTE — ED Notes (Signed)
Report was received from Nell RangeNoel W., RN; Pt. Verbalizes no complaints or distress; verbalizes having S.I.; denies having Hi. Continue to monitor with 15 min. Monitoring.

## 2019-04-21 ENCOUNTER — Other Ambulatory Visit: Payer: Self-pay | Admitting: *Deleted

## 2019-04-21 DIAGNOSIS — Z20822 Contact with and (suspected) exposure to covid-19: Secondary | ICD-10-CM

## 2019-04-22 LAB — NOVEL CORONAVIRUS, NAA: SARS-CoV-2, NAA: NOT DETECTED

## 2019-08-26 DIAGNOSIS — U071 COVID-19: Secondary | ICD-10-CM

## 2019-08-26 HISTORY — DX: COVID-19: U07.1

## 2020-02-20 ENCOUNTER — Emergency Department: Payer: Self-pay

## 2020-02-20 ENCOUNTER — Encounter: Payer: Self-pay | Admitting: Emergency Medicine

## 2020-02-20 ENCOUNTER — Emergency Department
Admission: EM | Admit: 2020-02-20 | Discharge: 2020-02-20 | Disposition: A | Discharge status: Home / Self Care | Payer: Self-pay | Attending: Emergency Medicine | Admitting: Emergency Medicine

## 2020-02-20 ENCOUNTER — Other Ambulatory Visit: Payer: Self-pay

## 2020-02-20 DIAGNOSIS — W19XXXA Unspecified fall, initial encounter: Secondary | ICD-10-CM | POA: Insufficient documentation

## 2020-02-20 DIAGNOSIS — M79672 Pain in left foot: Secondary | ICD-10-CM | POA: Insufficient documentation

## 2020-02-20 DIAGNOSIS — Y929 Unspecified place or not applicable: Secondary | ICD-10-CM | POA: Insufficient documentation

## 2020-02-20 DIAGNOSIS — Y999 Unspecified external cause status: Secondary | ICD-10-CM | POA: Insufficient documentation

## 2020-02-20 DIAGNOSIS — M79673 Pain in unspecified foot: Secondary | ICD-10-CM

## 2020-02-20 DIAGNOSIS — M25462 Effusion, left knee: Secondary | ICD-10-CM | POA: Insufficient documentation

## 2020-02-20 DIAGNOSIS — Y939 Activity, unspecified: Secondary | ICD-10-CM | POA: Insufficient documentation

## 2020-02-20 DIAGNOSIS — F17200 Nicotine dependence, unspecified, uncomplicated: Secondary | ICD-10-CM | POA: Insufficient documentation

## 2020-02-20 MED ORDER — IBUPROFEN 600 MG PO TABS
600.0000 mg | ORAL_TABLET | Freq: Once | ORAL | Status: AC
  Administered 2020-02-20: 600 mg via ORAL
  Filled 2020-02-20: qty 1

## 2020-02-20 MED ORDER — IBUPROFEN 600 MG PO TABS: 600.0000 mg | ORAL_TABLET | Freq: Three times a day (TID) | ORAL | 0 refills | Status: DC | PRN

## 2020-02-20 MED ORDER — TRAMADOL HCL 50 MG PO TABS: 50.0000 mg | ORAL_TABLET | Freq: Four times a day (QID) | ORAL | 0 refills | Status: AC | PRN

## 2020-02-20 MED ORDER — TRAMADOL HCL 50 MG PO TABS
50.0000 mg | ORAL_TABLET | Freq: Once | ORAL | Status: AC
  Administered 2020-02-20: 50 mg via ORAL
  Filled 2020-02-20: qty 1

## 2020-02-20 NOTE — Discharge Instructions (Signed)
Follow discharge care instruction.  Ambulate with support for 3 to 5 days.  Take medication as directed.

## 2020-02-20 NOTE — ED Triage Notes (Signed)
Patient is complaining of left foot pain after a fall on Saturday.  Patient states he thought the pain would improve with time but it has not.  Patient states pain is much worse with standing and attempting to bear weight.

## 2020-02-20 NOTE — ED Notes (Signed)
See triage note  Presents with pain to left foot  States he had a fall on Saturday

## 2020-02-20 NOTE — ED Notes (Signed)
Pt discharged home after verbalizing understanding of discharge instructions; nad noted. 

## 2020-02-20 NOTE — ED Provider Notes (Signed)
[facility] Emergency Department Provider Note   ____________________________________________   First MD Initiated Contact with Patient 02/20/20 [phone]     (approximate)  I have reviewed the triage vital signs and the nursing notes.   HISTORY  Chief Complaint Foot Pain    HPI Derrick Hopkins is a 49 y.o. male patient complain of left lower extremity pain secondary to a fall 2 days ago.  Patient states pain increased with standing attempt to bear weight.  Patient also voices concern about distended veins in the left lower leg in comparison to the right leg.  Patient rates his pain a 7/10.  Patient described the pain is "achy".  No relief over-the-counter anti-inflammatory medications.  Patient denies dyspnea or chest pain.         History reviewed. No pertinent past medical history.  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 Authorizing Provider  ibuprofen (ADVIL) 600 MG tablet Take 1 tablet (600 mg total) by mouth every 8 (eight) hours as needed. 02/20/20   Joni Reining, PA-C  traMADol (ULTRAM) 50 MG tablet Take 1 tablet (50 mg total) by mouth every 6 (six) hours as needed. 02/20/20 02/19/21  Joni Reining, PA-C    Allergies Patient has no known allergies.  No family history on file.  Social History Social History   Tobacco Use   Smoking status: Current Every Day Smoker   Smokeless tobacco: Never Used  Substance Use Topics   Alcohol use: Not on file   Drug use: Not on file    Review of  Systems  Constitutional: No fever/chills Eyes: No visual changes. ENT: No sore throat. Cardiovascular: Denies chest pain. Respiratory: Denies shortness of breath. Gastrointestinal: No abdominal pain.  No nausea, no vomiting.  No diarrhea.  No constipation. Genitourinary: Negative for dysuria. Musculoskeletal: Negative for back pain. Skin: Negative for rash. Neurological: Negative for headaches, focal weakness or numbness. Psychiatric:  Adjustment disorder   ____________________________________________   PHYSICAL EXAM:  VITAL SIGNS: ED Triage Vitals  Enc Vitals Group     BP 02/20/20 0849 135/81     Pulse Rate 02/20/20 0849 70     Resp 02/20/20 0849 18     Temp 02/20/20 0849 98 F (36.7 C)     Temp Source 02/20/20 0849 Oral     SpO2 02/20/20 0849 96 %     Weight 02/20/20 0823 252 lb (114.3 kg)     Height 02/20/20 0823 6' (1.829 m)     Head Circumference --      Peak Flow --      Pain Score 02/20/20 0823 7     Pain Loc --      Pain Edu? --      Excl. in GC? --     Constitutional: Alert and oriented. Well appearing and in no acute distress. Cardiovascular: Normal rate, regular rhythm. Grossly normal heart sounds.  Good peripheral circulation.  Venous distention left lower leg. Respiratory: Normal respiratory effort.  No retractions. Lungs CTAB. Musculoskeletal: No obvious deformity to  the left lower leg.  Moderate guarding palpation of the anterior patella.  Neurologic:  Normal speech and language. No gross focal neurologic deficits are appreciated. No gait instability. Skin:  Skin is warm, dry and intact. No rash noted.  No abrasions or ecchymosis. Psychiatric: Mood and affect are normal. Speech and behavior are normal.  ____________________________________________   LABS (all labs ordered are listed, but only abnormal results are displayed)  Labs Reviewed - No data to  display ____________________________________________  EKG   ____________________________________________  RADIOLOGY  ED MD interpretation:    Official radiology report(s): US Venous Img Lower Unilateral Left (DVT)  Result Date: 02/20/2020 CLINICAL DATA:  49 year old male with a history of foot pain EXAM: LEFT LOWER EXTREMITY VENOUS DOPPLER ULTRASOUND TECHNIQUE: Gray-scale sonography with graded compression, as well as color Doppler and duplex ultrasound were performed to evaluate the lower extremity deep venous systems from the level of the common femoral vein and including the common femoral, femoral, profunda femoral, popliteal and calf veins including the posterior tibial, peroneal and gastrocnemius veins when visible. The superficial great saphenous vein was also interrogated. Spectral Doppler was utilized to evaluate flow at rest and with distal augmentation maneuvers in the common femoral, femoral and popliteal veins. COMPARISON:  None. FINDINGS: Contralateral Common Femoral Vein: Respiratory phasicity is normal and symmetric with the symptomatic side. No evidence of thrombus. Normal compressibility. Common Femoral Vein: No evidence of thrombus. Normal compressibility, respiratory phasicity and response to augmentation. Saphenofemoral Junction: No evidence of thrombus. Normal compressibility and flow on color Doppler imaging. Profunda Femoral Vein: No evidence of thrombus. Normal compressibility and flow on color Doppler imaging. Femoral Vein: No evidence of thrombus. Normal compressibility, respiratory phasicity and response to augmentation. Popliteal Vein: No evidence of thrombus. Normal compressibility, respiratory phasicity and response to augmentation. Calf Veins: No evidence of thrombus. Normal compressibility and flow on color Doppler imaging. Superficial Great Saphenous Vein: No evidence of thrombus. Normal compressibility and flow on color Doppler imaging. Other Findings:  Fluid at the  level of the knee, possible effusion. IMPRESSION: Sonographic survey of the left lower extremity negative for DVT Possible knee effusion.  Correlation with plain films may be useful. Electronically Signed   By: Corrie Mckusick D.O.   On: 02/20/2020 11:14   DG Foot Complete Left  Result Date: 02/20/2020 CLINICAL DATA:  Pain after fall EXAM: LEFT FOOT - COMPLETE 3+ VIEW COMPARISON:  None. FINDINGS: Alignment is anatomic. No acute fracture. Joint spaces are preserved. IMPRESSION: Negative. Electronically Signed   By: Macy Mis M.D.   On: 02/20/2020 08:51    ____________________________________________   PROCEDURES  Procedure(s) performed (including Critical Care):  Procedures   ____________________________________________   INITIAL IMPRESSION / ASSESSMENT AND PLAN / ED COURSE  As part of my medical decision making, I reviewed the following data within the [place]     Patient presents with left lower leg pain secondary to fall.  Patient pain increased with weightbearing or ambulation.  Patient has venous distention in comparison to the right leg.  Differentials consist of knee contusion, venous distention, pain secondary to contusion, for DVT.  Discussed x-ray and CT findings with patient consistent with mild knee effusion.  Patient placed in a knee immobilizer and given crutches assist with ambulation.  Patient advised to follow orthopedic if no improvement in 3 to 5 days.  Derrick Hopkins was evaluated in Emergency Department on 02/20/2020 for the symptoms described in the history of present illness. He was evaluated in  the context of the global COVID-19 pandemic, which necessitated consideration that the patient might be at risk for infection with the SARS-CoV-2 virus that causes COVID-19. Institutional protocols and algorithms that pertain to the evaluation of patients at risk for COVID-19 are in a state of rapid change based on information released by regulatory  bodies including the CDC and federal and state organizations. These policies and algorithms were followed during the patient's care in the ED.           ____________________________________________   FINAL CLINICAL IMPRESSION(S) / ED DIAGNOSES  Final diagnoses:  Foot pain  Effusion of left knee joint     ED Discharge Orders         Ordered    traMADol (ULTRAM) 50 MG tablet  Every 6 hours PRN     Discontinue  Reprint     02/20/20 1136    ibuprofen (ADVIL) 600 MG tablet  Every 8 hours PRN     Discontinue  Reprint     02/20/20 1136           Note:  This document was prepared using Dragon voice recognition software and may include unintentional dictation errors.    Joni Reining, PA-C 02/20/20 1145    Arnaldo Natal, MD 02/20/20 [phone]

## 2020-04-17 ENCOUNTER — Other Ambulatory Visit: Payer: Self-pay

## 2020-04-17 ENCOUNTER — Emergency Department: Payer: Self-pay

## 2020-04-17 ENCOUNTER — Emergency Department
Admission: EM | Admit: 2020-04-17 | Discharge: 2020-04-17 | Disposition: A | Discharge status: Home / Self Care | Payer: Self-pay | Attending: Emergency Medicine | Admitting: Emergency Medicine

## 2020-04-17 ENCOUNTER — Encounter: Payer: Self-pay | Admitting: Emergency Medicine

## 2020-04-17 DIAGNOSIS — M25511 Pain in right shoulder: Secondary | ICD-10-CM | POA: Insufficient documentation

## 2020-04-17 DIAGNOSIS — M7022 Olecranon bursitis, left elbow: Secondary | ICD-10-CM | POA: Insufficient documentation

## 2020-04-17 DIAGNOSIS — Z79899 Other long term (current) drug therapy: Secondary | ICD-10-CM | POA: Insufficient documentation

## 2020-04-17 DIAGNOSIS — Y9389 Activity, other specified: Secondary | ICD-10-CM | POA: Insufficient documentation

## 2020-04-17 DIAGNOSIS — F17210 Nicotine dependence, cigarettes, uncomplicated: Secondary | ICD-10-CM | POA: Insufficient documentation

## 2020-04-17 MED ORDER — TRAMADOL HCL 50 MG PO TABS: 50.0000 mg | ORAL_TABLET | Freq: Four times a day (QID) | ORAL | 0 refills | Status: DC | PRN

## 2020-04-17 MED ORDER — MELOXICAM 15 MG PO TABS
15.0000 mg | ORAL_TABLET | Freq: Every day | ORAL | 2 refills | Status: DC
Start: 2020-04-17 — End: 2021-04-08

## 2020-04-17 MED ORDER — LIDOCAINE HCL (PF) 1 % IJ SOLN
5.0000 mL | Freq: Once | INTRAMUSCULAR | Status: AC
  Administered 2020-04-17: 5 mL
  Filled 2020-04-17: qty 5

## 2020-04-17 NOTE — ED Notes (Signed)
Patient declined discharge vital signs. 
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
## 2020-04-17 NOTE — Discharge Instructions (Signed)
Follow discharge care instructions and wear arm sling for 3 to 5 days.  Take medication as directed.

## 2020-04-20 ENCOUNTER — Encounter: Payer: Self-pay | Admitting: Emergency Medicine

## 2020-04-20 ENCOUNTER — Emergency Department
Admission: EM | Admit: 2020-04-20 | Discharge: 2020-04-20 | Disposition: A | Discharge status: Home / Self Care | Payer: Self-pay | Attending: Emergency Medicine | Admitting: Emergency Medicine

## 2020-04-20 ENCOUNTER — Other Ambulatory Visit: Payer: Self-pay

## 2020-04-20 ENCOUNTER — Emergency Department: Payer: Self-pay

## 2020-04-20 DIAGNOSIS — R42 Dizziness and giddiness: Secondary | ICD-10-CM | POA: Insufficient documentation

## 2020-04-20 DIAGNOSIS — F17200 Nicotine dependence, unspecified, uncomplicated: Secondary | ICD-10-CM | POA: Insufficient documentation

## 2020-04-20 LAB — DIFFERENTIAL
Abs Immature Granulocytes: 0.04 10*3/uL (ref 0.00–0.07)
Basophils Absolute: 0 10*3/uL (ref 0.0–0.1)
Basophils Relative: 1 %
Eosinophils Absolute: 0.1 10*3/uL (ref 0.0–0.5)
Eosinophils Relative: 2 %
Immature Granulocytes: 1 %
Lymphocytes Relative: 25 %
Lymphs Abs: 1.4 10*3/uL (ref 0.7–4.0)
Monocytes Absolute: 0.5 10*3/uL (ref 0.1–1.0)
Monocytes Relative: 9 %
Neutro Abs: 3.5 10*3/uL (ref 1.7–7.7)
Neutrophils Relative %: 62 %

## 2020-04-20 LAB — CBC
HCT: 43.4 % (ref 39.0–52.0)
Hemoglobin: 15.3 g/dL (ref 13.0–17.0)
MCH: 34.8 pg — ABNORMAL HIGH (ref 26.0–34.0)
MCHC: 35.3 g/dL (ref 30.0–36.0)
MCV: 98.6 fL (ref 80.0–100.0)
Platelets: 232 10*3/uL (ref 150–400)
RBC: 4.4 MIL/uL (ref 4.22–5.81)
RDW: 12.4 % (ref 11.5–15.5)
WBC: 5.6 10*3/uL (ref 4.0–10.5)
nRBC: 0 % (ref 0.0–0.2)

## 2020-04-20 LAB — CSF CELL COUNT WITH DIFFERENTIAL
Eosinophils, CSF: 0 %
Lymphs, CSF: 36 %
Monocyte-Macrophage-Spinal Fluid: 50 %
Other Cells, CSF: 0
RBC Count, CSF: 3447 /mm3 — ABNORMAL HIGH (ref 0–3)
Segmented Neutrophils-CSF: 14 %
Tube #: 1
WBC, CSF: 0 /mm3 (ref 0–5)

## 2020-04-20 LAB — COMPREHENSIVE METABOLIC PANEL
ALT: 20 U/L (ref 0–44)
AST: 23 U/L (ref 15–41)
Albumin: 4 g/dL (ref 3.5–5.0)
Alkaline Phosphatase: 45 U/L (ref 38–126)
Anion gap: 11 (ref 5–15)
BUN: 10 mg/dL (ref 6–20)
CO2: 23 mmol/L (ref 22–32)
Calcium: 8.9 mg/dL (ref 8.9–10.3)
Chloride: 101 mmol/L (ref 98–111)
Creatinine, Ser: 0.92 mg/dL (ref 0.61–1.24)
GFR calc Af Amer: >60 mL/min (ref >60)
GFR calc non Af Amer: >60 mL/min (ref >60)
Glucose, Bld: 243 mg/dL — ABNORMAL HIGH (ref 70–99)
Potassium: 3.9 mmol/L (ref 3.5–5.1)
Sodium: 135 mmol/L (ref 135–145)
Total Bilirubin: 0.8 mg/dL (ref 0.3–1.2)
Total Protein: 7.4 g/dL (ref 6.5–8.1)

## 2020-04-20 LAB — TROPONIN I (HIGH SENSITIVITY): Troponin I (High Sensitivity): 5 ng/L (ref <18)

## 2020-04-20 LAB — SEDIMENTATION RATE: Sed Rate: 7 mm/hr (ref 0–15)

## 2020-04-20 MED ORDER — LIDOCAINE-EPINEPHRINE 2 %-1:100000 IJ SOLN
20.0000 mL | Freq: Once | INTRAMUSCULAR | Status: AC
  Administered 2020-04-20: 20 mL

## 2020-04-20 NOTE — ED Triage Notes (Signed)
Says elbow left is swollen bigger that when he had it drained.  Also for 3 hours now he feels dizziness.

## 2020-04-20 NOTE — ED Triage Notes (Signed)
Says lightheaded.  Has wound on elbow that he was seen here for and it is getting worse.

## 2020-04-20 NOTE — ED Notes (Signed)
Drew green and purple top, sent to lab

## 2020-04-20 NOTE — Discharge Instructions (Addendum)
Wear the Ace wrap fairly tight on your elbow.  Follow-up with Dr. Martha Clan the orthopedic doctor.  Give him a call when you get home try to schedule appointment for Monday or Tuesday.  Let them know that he will be expecting you.  Return for any increasing pain redness or swelling.  Make sure you are drinking plenty of fluids.  Try not to take too much tramadol.  You can follow-up with the [facility] or the [facility] or the [facility] or [facility] or you can try to go to [facility].  They have a charity care program for people without insurance.  All of these clinics and have some wait time with them and have a lot of paperwork to fill out but generally you can get in them after little wait.  Please return here if you have further episodes of dizziness or sweating for no reason.  Since you have to leave now cannot wait for the troponin to come back we will call you if they turn positive.  If they are positive you will need to come back right away.  He will need to follow-up with one of the clinics for your lightheadedness and your high blood sugars.  Also for the nonspecific T wave changes on the EKG.
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
## 2020-04-24 LAB — BODY FLUID CULTURE: Culture: NO GROWTH

## 2020-08-19 ENCOUNTER — Other Ambulatory Visit: Payer: Self-pay

## 2020-08-19 ENCOUNTER — Emergency Department
Admission: EM | Admit: 2020-08-19 | Discharge: 2020-08-19 | Disposition: A | Discharge status: Home / Self Care | Payer: 59 | Attending: Emergency Medicine | Admitting: Emergency Medicine

## 2020-08-19 ENCOUNTER — Encounter: Payer: Self-pay | Admitting: Emergency Medicine

## 2020-08-19 ENCOUNTER — Emergency Department: Payer: 59

## 2020-08-19 DIAGNOSIS — F17200 Nicotine dependence, unspecified, uncomplicated: Secondary | ICD-10-CM | POA: Diagnosis not present

## 2020-08-19 DIAGNOSIS — R0789 Other chest pain: Secondary | ICD-10-CM
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
## 2020-09-22 ENCOUNTER — Emergency Department
Admission: EM | Admit: 2020-09-22 | Discharge: 2020-09-22 | Disposition: A | Discharge status: Home / Self Care | Payer: 59 | Attending: Emergency Medicine | Admitting: Emergency Medicine

## 2020-09-22 ENCOUNTER — Other Ambulatory Visit: Payer: Self-pay

## 2020-09-22 ENCOUNTER — Emergency Department: Payer: 59

## 2020-09-22 DIAGNOSIS — R0981 Nasal congestion: Secondary | ICD-10-CM | POA: Diagnosis present

## 2020-09-22 DIAGNOSIS — F17200 Nicotine dependence, unspecified, uncomplicated: Secondary | ICD-10-CM | POA: Diagnosis not present

## 2020-09-22 DIAGNOSIS — U071 COVID-19: Secondary | ICD-10-CM | POA: Insufficient documentation

## 2020-09-22 MED ORDER — PREDNISONE 10 MG (21) PO TBPK: ORAL_TABLET | ORAL | 0 refills | Status: DC

## 2020-09-22 NOTE — ED Provider Notes (Signed)
[facility] Emergency Department Provider Note  ____________________________________________  Time seen: Approximately 6:31 PM  I have reviewed the triage vital signs and the nursing notes.   HISTORY  Chief Complaint Nasal Congestion   HPI Derrick Hopkins is a 50 y.o. male presents to the emergency department for treatment and evaluation of sinus congestion. He had been diagnosed with COVID on 09/07/20. He has continued to have sinus congestion and cough. He has been unable to return to work and needs to known if it is safe to return.   No past medical history on file.  Patient Active Problem List   Diagnosis Date Noted  . Tobacco use disorder 03/01/2016  . Involuntary commitment 03/01/2016  . Substance induced mood disorder (HCC) 11/08/2015  . 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 Alcohol use disorder, mild, abuse 11/08/2015  . Suicidal ideation 11/08/2015    Past Surgical History:  Procedure Laterality Date  . HERNIA REPAIR      Prior to Admission medications   Medication Sig Start Date End Date Taking? Authorizing Provider  predniSONE (STERAPRED UNI-PAK 21 TAB) 10 MG (21) TBPK tablet Take 6 tablets on the first day and decrease by 1 tablet each day until finished. 09/22/20  Yes Hubbard Seldon B, FNP  ibuprofen (ADVIL) 600 MG tablet Take 1 tablet (600 mg total) by mouth every 8 (eight) hours as needed. 02/20/20   Joni Reining, PA-C  meloxicam (MOBIC) 15 MG tablet Take 1 tablet (15 mg total) by mouth daily. 04/17/20 04/17/21  Joni Reining, PA-C  traMADol (ULTRAM) 50 MG tablet Take 1 tablet (50 mg total) by mouth every 6 (six) hours as needed. 02/20/20 02/19/21  Joni Reining, PA-C  traMADol (ULTRAM) 50 MG tablet Take 1 tablet (50 mg total) by mouth every 6 (six) hours as needed. 04/17/20 04/17/21  Joni Reining, PA-C    Allergies Patient has no known allergies.  No family history on file.  Social  History Social History   Tobacco Use  . Smoking status: Current Every Day Smoker  . Smokeless tobacco: Never Used  Substance Use Topics  . Alcohol use: Yes  . Drug use: Not Currently    Review of Systems Constitutional: Negative for fever/chills. Normal appetite. ENT: Negative for sore throat. Cardiovascular: Denies chest pain. Respiratory: Negative for shortness of breath. Positive for cough. Negataive for wheezing.  Gastrointestinal: Negative for nausea,   no vomiting.  Negative for diarrhea.  Musculoskeletal: Negative for body aches Skin: Negative for rash. Neurological: Negative for headaches ____________________________________________   PHYSICAL EXAM:  VITAL SIGNS: ED Triage Vitals  Enc Vitals Group     BP 09/22/20 1811 (!) 153/80     Pulse Rate 09/22/20 1811 66     Resp 09/22/20 1811 20     Temp 09/22/20 1811 98.2 F (36.8 C)     Temp Source 09/22/20 1811 Oral     SpO2 09/22/20 1811 97 %     Weight --      Height --      Head Circumference --      Peak Flow --      Pain Score 09/22/20 1806 0     Pain Loc --      Pain Edu? --      Excl. in GC? --     Constitutional: Alert and oriented. Well appearing and in no acute distress. Eyes: Conjunctivae are normal. Ears: Bilateral TM normal Nose: Maxillary sinus  congestion noted; no rhinnorhea. Mouth/Throat: Mucous membranes are moist.  Oropharynx clear. Tonsils normal. Uvula midline. Neck: No stridor.  Lymphatic: No cervical lymphadenopathy. Cardiovascular: Normal rate, regular rhythm. Good peripheral circulation. Respiratory: Respirations are even and unlabored.  No retractions. Breath sounds clear to auscultation. Gastrointestinal: Soft and nontender.  Musculoskeletal: FROM x 4 extremities.  Neurologic:  Normal speech and language. Skin:  Skin is warm, dry and intact. No rash noted. Psychiatric: Mood and affect are normal. Speech and behavior are normal.  ____________________________________________    LABS (all labs ordered are listed, but only abnormal results are displayed)  Labs Reviewed - No data to display ____________________________________________  EKG   ____________________________________________  RADIOLOGY  Chest x-ray negative for acute concerns. ____________________________________________   PROCEDURES  Procedure(s) performed: None  Critical Care performed: No ____________________________________________   INITIAL IMPRESSION / ASSESSMENT AND PLAN / ED COURSE  50 y.o. male who presents to the emergency department for treatment and evaluation of cough and sinus congestion. Chest x-ray is normal.  Patient to be discharged home with a Rx. For prednisone. He is clear to return to work. He was advised to follow up with primary care for symptoms that are not improving over the next few days.   Medications - No data to display  ED Discharge Orders         Ordered    predniSONE (STERAPRED UNI-PAK 21 TAB) 10 MG (21) TBPK tablet        09/22/20 1915           Pertinent labs & imaging results that were available during my care of the patient were reviewed by me and considered in my medical decision making (see chart for details).    If controlled substance prescribed during this visit, 12 month history viewed on the [facility] prior to issuing an initial prescription for Schedule II or III opiod. ____________________________________________   FINAL CLINICAL IMPRESSION(S) / ED DIAGNOSES  Final diagnoses:  COVID    Note:  This document was prepared using Dragon voice recognition software and may include unintentional dictation errors.    Chinita Pester, FNP 09/23/20 1723    Phineas Semen, MD 09/23/20 [phone]

## 2020-09-22 NOTE — Discharge Instructions (Signed)
The medication prescribed tonight should help with your breathing as well as the nasal congestion.

## 2020-09-22 NOTE — ED Triage Notes (Signed)
Pt presents c/o sinus congestion. + Covid test on 09/07/20. Ambulatory to triage.

## 2021-01-25 ENCOUNTER — Other Ambulatory Visit: Payer: Self-pay

## 2021-01-25 ENCOUNTER — Emergency Department: Payer: 59

## 2021-01-25 DIAGNOSIS — Z20822 Contact with and (suspected) exposure to covid-19: Secondary | ICD-10-CM | POA: Diagnosis not present

## 2021-01-25 DIAGNOSIS — J40 Bronchitis, not specified as acute or chronic: Secondary | ICD-10-CM | POA: Diagnosis not present

## 2021-01-25 DIAGNOSIS — F17200 Nicotine dependence, unspecified, uncomplicated: Secondary | ICD-10-CM | POA: Diagnosis not present

## 2021-01-25 DIAGNOSIS — R0602 Shortness of breath: Secondary | ICD-10-CM | POA: Diagnosis present

## 2021-01-25 LAB — BASIC METABOLIC PANEL
Anion gap: 11 (ref 5–15)
BUN: 12 mg/dL (ref 6–20)
CO2: 20 mmol/L — ABNORMAL LOW (ref 22–32)
Calcium: 8.9 mg/dL (ref 8.9–10.3)
Chloride: 106 mmol/L (ref 98–111)
Creatinine, Ser: 1.01 mg/dL (ref 0.61–1.24)
GFR, Estimated: >60 mL/min (ref >60)
Glucose, Bld: 177 mg/dL — ABNORMAL HIGH (ref 70–99)
Potassium: 3.9 mmol/L (ref 3.5–5.1)
Sodium: 137 mmol/L (ref 135–145)

## 2021-01-25 LAB — TROPONIN I (HIGH SENSITIVITY): Troponin I (High Sensitivity): 7 ng/L (ref <18)

## 2021-01-25 LAB — CBC
HCT: 43.5 % (ref 39.0–52.0)
Hemoglobin: 15.7 g/dL (ref 13.0–17.0)
MCH: 34.9 pg — ABNORMAL HIGH (ref 26.0–34.0)
MCHC: 36.1 g/dL — ABNORMAL HIGH (ref 30.0–36.0)
MCV: 96.7 fL (ref 80.0–100.0)
Platelets: 246 10*3/uL (ref 150–400)
RBC: 4.5 MIL/uL (ref 4.22–5.81)
RDW: 12.5 % (ref 11.5–15.5)
WBC: 7 10*3/uL (ref 4.0–10.5)
nRBC: 0 % (ref 0.0–0.2)

## 2021-01-25 NOTE — ED Triage Notes (Signed)
Pt presents to ER c/o chest pain and SOB x3 weeks.  Pt states he doesn't feel like his lungs are expanding like they should.  Pt states he has had a productive cough for last 3 weeks and has been coughing up white colored sputum.  Pt sounds congested.  No acute distress noted at this time.

## 2021-01-26 ENCOUNTER — Emergency Department
Admission: EM | Admit: 2021-01-26 | Discharge: 2021-01-26 | Disposition: A | Discharge status: Home / Self Care | Payer: 59 | Attending: Emergency Medicine | Admitting: Emergency Medicine

## 2021-01-26 DIAGNOSIS — J40 Bronchitis, not specified as acute or chronic: Secondary | ICD-10-CM

## 2021-01-26 LAB — RESP PANEL BY RT-PCR (FLU A&B, COVID) ARPGX2
Influenza A by PCR: NEGATIVE
Influenza B by PCR: NEGATIVE
SARS Coronavirus 2 by RT PCR: NEGATIVE

## 2021-01-26 LAB — D-DIMER, QUANTITATIVE: D-Dimer, Quant: 0.47 ug/mL-FEU (ref 0.00–0.50)

## 2021-01-26 LAB — TROPONIN I (HIGH SENSITIVITY): Troponin I (High Sensitivity): 7 ng/L (ref <18)

## 2021-01-26 MED ORDER — METHYLPREDNISOLONE SODIUM SUCC 125 MG IJ SOLR
80.0000 mg | Freq: Once | INTRAMUSCULAR | Status: AC
  Administered 2021-01-26: 80 mg via INTRAVENOUS
  Filled 2021-01-26: qty 2

## 2021-01-26 MED ORDER — ALBUTEROL SULFATE HFA 108 (90 BASE) MCG/ACT IN AERS: 2.0000 | INHALATION_SPRAY | RESPIRATORY_TRACT | 0 refills | Status: DC | PRN

## 2021-01-26 MED ORDER — IPRATROPIUM-ALBUTEROL 0.5-2.5 (3) MG/3ML IN SOLN
3.0000 mL | Freq: Once | RESPIRATORY_TRACT | Status: AC
Start: 2021-01-26 — End: 2021-01-26
  Administered 2021-01-26: 3 mL via RESPIRATORY_TRACT
  Filled 2021-01-26: qty 3

## 2021-01-26 MED ORDER — AZITHROMYCIN 250 MG PO TABS: 250.0000 mg | ORAL_TABLET | Freq: Every day | ORAL | 0 refills | Status: DC

## 2021-01-26 MED ORDER — PREDNISONE 20 MG PO TABS: ORAL_TABLET | ORAL | 0 refills | Status: DC

## 2021-01-26 MED ORDER — AZITHROMYCIN 500 MG PO TABS
500.0000 mg | ORAL_TABLET | Freq: Once | ORAL | Status: AC
  Administered 2021-01-26: 500 mg via ORAL
  Filled 2021-01-26: qty 1

## 2021-01-26 NOTE — Discharge Instructions (Signed)
1.  Finish steroid and antibiotic as prescribed: Prednisone 60mg  daily x4 days Azithromycin 250mg  daily x4 days 2.  Use Albuterol inhaler 2 puffs every 4 hours as needed for cough/wheezing/difficulty breathing. 3.  Return to the ER for worsening symptoms, persistent vomiting, difficulty breathing or other concerns.

## 2021-01-26 NOTE — ED Provider Notes (Signed)
[facility] Emergency Department Provider Note   ____________________________________________   Event Date/Time   First MD Initiated Contact with Patient 01/26/21 0407     (approximate)  I have reviewed the triage vital signs and the nursing notes.   HISTORY  Chief Complaint Chest Pain and Shortness of Breath    HPI Derrick Hopkins is a 50 y.o. male who presents to the ED from home with a chief complaint of chest pain and shortness of breath x3 weeks.  States he feels like his lungs are not expanding like they should.  Also endorses productive cough for the last 3 weeks with white-colored sputum.  Complains of chest congestion and wheezing.  Denies fever, abdominal pain, nausea, vomiting, diarrhea or dizziness.  Patient had COVID-19 09/07/2020.     Past medical history Substance-induced mood disorder Suicidal ideation Adjustment disorder Alcohol use disorder Tobacco use disorder  Patient Active Problem List   Diagnosis Date Noted  . Tobacco use disorder 03/01/2016  . Involuntary commitment 03/01/2016  . Substance induced mood disorder (HCC) 11/08/2015  . 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 Alcohol use disorder, mild, abuse 11/08/2015  . Suicidal ideation 11/08/2015    Past Surgical History:  Procedure Laterality Date  . HERNIA REPAIR      Prior to Admission medications   Medication Sig Start Date End Date Taking? Authorizing Provider  albuterol (VENTOLIN HFA) 108 (90 Base) MCG/ACT inhaler Inhale 2 puffs into the lungs every 4 (four) hours as needed for wheezing or shortness of breath. 01/26/21  Yes Irean Hong, MD  azithromycin (ZITHROMAX) 250 MG tablet Take 1 tablet (250 mg total) by mouth daily. 01/26/21  Yes Irean Hong, MD  predniSONE (DELTASONE) 20 MG tablet 3 tablets daily x 4 days 01/26/21  Yes Irean Hong, MD  ibuprofen (ADVIL) 600 MG tablet Take 1 tablet (600 mg total) by mouth every 8  (eight) hours as needed. 02/20/20   Joni Reining, PA-C  meloxicam (MOBIC) 15 MG tablet Take 1 tablet (15 mg total) by mouth daily. 04/17/20 04/17/21  Joni Reining, PA-C  traMADol (ULTRAM) 50 MG tablet Take 1 tablet (50 mg total) by mouth every 6 (six) hours as needed. 02/20/20 02/19/21  Joni Reining, PA-C  traMADol (ULTRAM) 50 MG tablet Take 1 tablet (50 mg total) by mouth every 6 (six) hours as needed. 04/17/20 04/17/21  Joni Reining, PA-C    Allergies Patient has no known allergies.  History reviewed. No pertinent family history.  Social History Social History   Tobacco Use  . Smoking status: Current Every Day Smoker  . Smokeless tobacco: Never Used  . Tobacco comment: 2-3/day   Substance Use Topics  . Alcohol use: Yes  . Drug use: Not Currently    Review of Systems  Constitutional: No fever/chills Eyes: No visual changes. ENT: No sore throat. Cardiovascular: Positive for chest pain. Respiratory: Positive for cough, wheezing and shortness of breath. Gastrointestinal: No abdominal pain.  No nausea, no vomiting.  No diarrhea.  No constipation. Genitourinary: Negative for dysuria. Musculoskeletal: Negative for back pain. Skin: Negative for rash. Neurological: Negative for headaches, focal weakness or numbness.   ____________________________________________   PHYSICAL EXAM:  VITAL SIGNS: ED Triage Vitals [01/25/21 2304]  Enc Vitals Group     BP (!) 151/77     Pulse Rate 82     Resp (!) 22     Temp 99.5 F (37.5 C)  Temp Source Oral     SpO2 97 %     Weight 240 lb (108.9 kg)     Height 6' (1.829 m)     Head Circumference      Peak Flow      Pain Score 4     Pain Loc      Pain Edu?      Excl. in GC?     Constitutional: Alert and oriented. Well appearing and in no acute distress. Eyes: Conjunctivae are normal. PERRL. EOMI. Head: Atraumatic. Nose: No congestion/rhinnorhea. Mouth/Throat: Mucous membranes are moist.   Neck: No stridor.    Cardiovascular: Normal rate, regular rhythm. Grossly normal heart sounds.  Good peripheral circulation. Respiratory: Normal respiratory effort.  No retractions. Lungs with scattered rhonchi and expiratory wheezes. Gastrointestinal: Soft and nontender. No distention. No abdominal bruits. No CVA tenderness. Musculoskeletal: No lower extremity tenderness nor edema.  No joint effusions. Neurologic:  Normal speech and language. No gross focal neurologic deficits are appreciated. No gait instability. Skin:  Skin is warm, dry and intact. No rash noted. Psychiatric: Mood and affect are normal. Speech and behavior are normal.  ____________________________________________   LABS (all labs ordered are listed, but only abnormal results are displayed)  Labs Reviewed  BASIC METABOLIC PANEL - Abnormal; Notable for the following components:      Result Value   CO2 20 (*)    Glucose, Bld 177 (*)    All other components within normal limits  CBC - Abnormal; Notable for the following components:   MCH 34.9 (*)    MCHC 36.1 (*)    All other components within normal limits  RESP PANEL BY RT-PCR (FLU A&B, COVID) [id number]  D-DIMER, QUANTITATIVE  TROPONIN I (HIGH SENSITIVITY)  TROPONIN I (HIGH SENSITIVITY)   ____________________________________________  EKG  ED ECG REPORT I, Adrian Specht J, the attending physician, personally viewed and interpreted this ECG.   Date: 01/26/2021  EKG Time: 2300  Rate: 85  Rhythm: normal EKG, normal sinus rhythm  Axis: Normal  Intervals:none  ST&T Change: Nonspecific  ____________________________________________  RADIOLOGY I, Jenavive Lamboy J, personally viewed and evaluated these images (plain radiographs) as part of my medical decision making, as well as reviewing the written report by the radiologist.  ED MD interpretation: No acute cardiopulmonary process  Official radiology report(s): DG Chest 2 View  Result Date: 01/25/2021 CLINICAL DATA:  Chest pain,  shortness of breath EXAM: CHEST - 2 VIEW COMPARISON:  09/22/2020 FINDINGS: Heart and mediastinal contours are within normal limits. No focal opacities or effusions. No acute bony abnormality. IMPRESSION: No active cardiopulmonary disease. Electronically Signed   By: Charlett Nose M.D.   On: 01/25/2021 23:27    ____________________________________________   PROCEDURES  Procedure(s) performed (including Critical Care):  Procedures   ____________________________________________   INITIAL IMPRESSION / ASSESSMENT AND PLAN / ED COURSE  As part of my medical decision making, I reviewed the following data within the electronic MEDICAL RECORD NUMBER Nursing notes reviewed and incorporated, Labs reviewed, EKG interpreted, Old chart reviewed, Radiograph reviewed  and Notes from prior ED visits     50 year old male presenting with chest pain and shortness of breath times several weeks. Differential includes, but is not limited to, viral syndrome, bronchitis including COPD exacerbation, pneumonia, reactive airway disease including asthma, CHF including exacerbation with or without pulmonary/interstitial edema, pneumothorax, ACS, thoracic trauma, and pulmonary embolism.  Initial troponin, chest x-ray and EKG unremarkable.  Will repeat troponin, obtain D-dimer given patient had COVID in January.  We will also obtain COVID swab tonight.  Administer IV Solu-Medrol, DuoNeb and reassess.  Clinical Course as of 01/26/21 1696  Sat Jan 26, 2021  [id number] Patient improved.  Updated him on all test results.  Will discharge home on prednisone, albuterol inhaler, Z-Pak for bronchitis.  Strict return precautions given.  Patient verbalizes understanding agrees with plan of care. [JS]    Clinical Course User Index [JS] Irean Hong, MD     ____________________________________________   FINAL CLINICAL IMPRESSION(S) / ED DIAGNOSES  Final diagnoses:  Bronchitis     ED Discharge Orders         Ordered     albuterol (VENTOLIN HFA) 108 (90 Base) MCG/ACT inhaler  Every 4 hours PRN        01/26/21 0607    azithromycin (ZITHROMAX) 250 MG tablet  Daily        01/26/21 0607    predniSONE (DELTASONE) 20 MG tablet        01/26/21 8101           Note:  This document was prepared using Dragon voice recognition software and may include unintentional dictation errors.   Irean Hong, MD 01/26/21 [phone]

## 2021-02-28 ENCOUNTER — Encounter: Payer: Self-pay | Admitting: Emergency Medicine

## 2021-02-28 ENCOUNTER — Emergency Department
Admission: EM | Admit: 2021-02-28 | Discharge: 2021-02-28 | Disposition: A | Discharge status: Home / Self Care | Payer: 59 | Attending: Emergency Medicine | Admitting: Emergency Medicine

## 2021-02-28 DIAGNOSIS — W1849XA Other slipping, tripping and stumbling without falling, initial encounter: Secondary | ICD-10-CM | POA: Insufficient documentation

## 2021-02-28 DIAGNOSIS — S0990XA Unspecified injury of head, initial encounter: Secondary | ICD-10-CM | POA: Diagnosis present

## 2021-02-28 DIAGNOSIS — Y9301 Activity, walking, marching and hiking: Secondary | ICD-10-CM | POA: Insufficient documentation

## 2021-02-28 DIAGNOSIS — R0981 Nasal congestion: Secondary | ICD-10-CM | POA: Diagnosis not present

## 2021-02-28 DIAGNOSIS — S0181XA Laceration without foreign body of other part of head, initial encounter: Secondary | ICD-10-CM | POA: Diagnosis not present

## 2021-02-28 DIAGNOSIS — S0101XA Laceration without foreign body of scalp, initial encounter: Secondary | ICD-10-CM

## 2021-02-28 DIAGNOSIS — F17200 Nicotine dependence, unspecified, uncomplicated: Secondary | ICD-10-CM | POA: Insufficient documentation

## 2021-02-28 MED ORDER — FLUTICASONE PROPIONATE 50 MCG/ACT NA SUSP: 1.0000 | Freq: Every day | NASAL | 0 refills | Status: DC

## 2021-02-28 MED ORDER — AMOXICILLIN-POT CLAVULANATE 875-125 MG PO TABS: 1.0000 | ORAL_TABLET | Freq: Two times a day (BID) | ORAL | 0 refills | Status: AC

## 2021-02-28 NOTE — Discharge Instructions (Addendum)
Return to the ER for headache, nausea, vomiting, vision changes, swelling or increased pain.

## 2021-02-28 NOTE — ED Triage Notes (Addendum)
Pt comes into the ED via POV c/o fall from where his dog knocked him over ov the brick stairs.  PT is unsure if the dog scratched him in the process of the fall or if the stair cut the back of his head.  Pt has scratch to the posterior side of his head.  All bleeding under control at this time.  Pt denies any LOC.  Pt denies any blood thinner use. Pt states this happened Saturday.

## 2021-02-28 NOTE — ED Provider Notes (Addendum)
[facility] EMERGENCY DEPARTMENT Provider Note   CSN: [id number] Arrival date & time: 02/28/21  1432     History Chief Complaint  Patient presents with   Derrick Hopkins is a 50 y.o. male presents to the emergency department for evaluation of a head injury that occurred 5 days ago.  Patient states he was walking and his dog tripped him, hit his head on the step or possibly the dog bit the back of his head.  He denies any LOC, nausea vomiting or headache.  Had a 1 inch laceration along the scalp that he has been cleaning daily but his parents were concerned that he should be seen to make sure he does not have a fracture nor infection.  His tetanus is updated from elbow procedure performed 1 year ago.  He denies any fevers, vision changes, nausea vomiting or headache.  He has some mild soreness along the incision but no scalp pain, swelling  Patient states he was seen a month ago for bronchitis.  Has had some intermittent nasal congestion.  Has been unable to get any relief except mild relief with Halls.  He has not tried any type of nasal sprays.  Denies any fevers  HPI     History reviewed. No pertinent past medical history.  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  Social History   Tobacco Use   Smoking status: Every Day    Pack years: 0.00   Smokeless tobacco: Never   Tobacco comments:    2-3/day   Substance Use Topics   Alcohol use: Yes   Drug use: Not Currently    Home Medications Prior to Admission medications   Medication Sig Start Date  End Date Taking? Authorizing Provider  amoxicillin-clavulanate (AUGMENTIN) 875-125 MG tablet Take 1 tablet by mouth every 12 (twelve) hours for 5 days. 02/28/21 03/05/21 Yes Evon Slack, PA-C  fluticasone (FLONASE) 50 MCG/ACT nasal spray Place 1 spray into both nostrils daily. 02/28/21 02/28/22 Yes Evon Slack, PA-C  albuterol (VENTOLIN HFA) 108 (90 Base) MCG/ACT inhaler Inhale 2 puffs into the lungs every 4 (four) hours as needed for wheezing or shortness of breath. 01/26/21   Irean Hong, MD  azithromycin (ZITHROMAX) 250 MG tablet Take 1 tablet (250 mg total) by mouth daily. 01/26/21   Irean Hong, MD  ibuprofen (ADVIL) 600 MG tablet Take 1 tablet (600 mg total) by mouth every 8 (eight) hours as needed. 02/20/20   Joni Reining, PA-C  meloxicam (MOBIC) 15 MG tablet Take 1 tablet (15 mg total) by mouth daily. 04/17/20 04/17/21  Joni Reining, PA-C  predniSONE (DELTASONE) 20 MG tablet 3 tablets daily x 4 days 01/26/21   Irean Hong, MD  traMADol (ULTRAM) 50 MG tablet Take 1 tablet (50 mg total) by mouth every 6 (six) hours as needed. 04/17/20 04/17/21  Joni Reining, PA-C    Allergies    Patient has no known allergies.  Review of Systems   Review of Systems  Constitutional:  Negative for  fever.  HENT:  Positive for congestion.   Eyes:  Negative for photophobia and visual disturbance.  Respiratory:  Negative for shortness of breath.   Cardiovascular:  Negative for chest pain.  Gastrointestinal:  Negative for nausea and vomiting.  Skin:  Positive for wound.  Neurological:  Negative for facial asymmetry, light-headedness, numbness and headaches.   Physical Exam Updated Vital Signs BP (!) 125/91 (BP Location: Left Arm)   Pulse 72   Temp 98.6 F (37 C) (Oral)   Resp 18   Ht 6' (1.829 m)   Wt 108.9 kg   SpO2 96%   BMI 32.55 kg/m   Physical Exam Constitutional:      Appearance: Normal appearance. He is well-developed.  HENT:     Head: Normocephalic and atraumatic.     Comments:  Nearly completely healed 1 inch laceration along the left posterior occipital region, very little soreness with no soft tissue swelling along the laceration site.  No skull tenderness to palpation around the laceration site.  There is no soft tissue swelling or ecchymosis.  Patient is nontender along the cervical spinous process.    Right Ear: External ear normal.     Left Ear: External ear normal.     Nose: Congestion present. No rhinorrhea.     Comments: Slightly swollen turbinates bilaterally but no septal hematoma.  No abnormal lesions    Mouth/Throat:     Pharynx: No oropharyngeal exudate or posterior oropharyngeal erythema.  Eyes:     Conjunctiva/sclera: Conjunctivae normal.  Cardiovascular:     Rate and Rhythm: Normal rate.     Pulses: Normal pulses.     Heart sounds: Normal heart sounds.  Pulmonary:     Effort: Pulmonary effort is normal. No respiratory distress.  Musculoskeletal:        General: Normal range of motion.     Cervical back: Normal range of motion.  Skin:    General: Skin is warm.     Findings: No rash.  Neurological:     General: No focal deficit present.     Mental Status: He is alert and oriented to person, place, and time. Mental status is at baseline.     Cranial Nerves: No cranial nerve deficit.     Motor: No weakness.     Gait: Gait normal.  Psychiatric:        Mood and Affect: Mood normal.        Behavior: Behavior normal.        Thought Content: Thought content normal.    ED Results / Procedures / Treatments   Labs (all labs ordered are listed, but only abnormal results are displayed) Labs Reviewed - No data to display  EKG None  Radiology No results found.  Procedures Procedures   Medications Ordered in ED Medications - No data to display  ED Course  I have reviewed the triage vital signs and the nursing notes.  Pertinent labs & imaging results that were available during my care of the patient were reviewed by me and considered in  my medical decision making (see chart for details).    MDM Rules/Calculators/A&P                          50 year old male with head injury that occurred 5 days ago.  Suffered a laceration that has healed with no drainage.  Uncertain if the dog bit him or if the step actually caused the laceration.  Dog's vaccines  are up-to-date, patient's tetanus is up-to-date.  Will place on a prophylactic antibiotic although there are no signs of infection today.  Patient with no reports of headache, LOC, nausea vomiting or current headache.  Neuro exam is normal.  He is not on any anticoagulant or medications.  No bony tenderness on exam.  We will hold off on any imaging at this time.  Patient understands signs symptoms return to the ER for.  Patient placed on Flonase as he describes 1 month of nasal congestion.  HEENT exam unremarkable, no intranasal lesions, abnormal swelling. Final Clinical Impression(s) / ED Diagnoses Final diagnoses:  Injury of head, initial encounter  Laceration of skin of scalp, initial encounter  Nasal congestion    Rx / DC Orders ED Discharge Orders          Ordered    fluticasone (FLONASE) 50 MCG/ACT nasal spray  Daily        02/28/21 1636    amoxicillin-clavulanate (AUGMENTIN) 875-125 MG tablet  Every 12 hours        02/28/21 1636             Evon Slack, PA-C 02/28/21 1642    Evon Slack, PA-C 02/28/21 1642    Chesley Noon, MD 03/05/21 1733

## 2021-04-08 ENCOUNTER — Emergency Department
Admission: EM | Admit: 2021-04-08 | Discharge: 2021-04-08 | Disposition: A | Discharge status: Home / Self Care | Payer: 59 | Attending: Emergency Medicine | Admitting: Emergency Medicine

## 2021-04-08 ENCOUNTER — Encounter: Payer: Self-pay | Admitting: Emergency Medicine

## 2021-04-08 ENCOUNTER — Other Ambulatory Visit: Payer: Self-pay

## 2021-04-08 ENCOUNTER — Emergency Department: Payer: 59

## 2021-04-08 DIAGNOSIS — R0789 Other chest pain: Secondary | ICD-10-CM | POA: Insufficient documentation

## 2021-04-08 DIAGNOSIS — R253 Fasciculation: Secondary | ICD-10-CM | POA: Insufficient documentation

## 2021-04-08 DIAGNOSIS — R059 Cough, unspecified: Secondary | ICD-10-CM | POA: Insufficient documentation

## 2021-04-08 DIAGNOSIS — F17200 Nicotine dependence, unspecified, uncomplicated: Secondary | ICD-10-CM | POA: Insufficient documentation

## 2021-04-08 MED ORDER — ALBUTEROL SULFATE HFA 108 (90 BASE) MCG/ACT IN AERS: 2.0000 | INHALATION_SPRAY | RESPIRATORY_TRACT | 0 refills | Status: DC | PRN

## 2021-04-08 NOTE — ED Triage Notes (Signed)
C/O cough today and 3 day history of left pleuritic pain with inspiration.  States had recent bronchitis that was treated with antibiotic and steroid.  Symptoms improved, but current symptoms started 3 days ago.  AAOx3.  Skin warm and dry. NAD.  No SOB/ DOE

## 2021-04-08 NOTE — ED Notes (Signed)
See triage note  Presents with cough and some chest discomfort with cough and inspiration  No fever

## 2021-04-08 NOTE — ED Provider Notes (Signed)
[facility] Emergency Department Provider Note   ____________________________________________   Event Date/Time   First MD Initiated Contact with Patient 04/08/21 1731     (approximate)  I have reviewed the triage vital signs and the nursing notes.   HISTORY  Chief Complaint Cough  HPI Derrick Hopkins is a 50 y.o. male presents himself to the ED for evaluation of mild cough aggravated by heat and humidity, and 3 days of intermittent left-sided chest wall pain increased with inspiration.  He also has noticed some twitching to his left pectoralis muscle.  He denies any frank shortness of breath, chest pain, diaphoresis.  He also has had no associated or significant cough.  Patient reports he was treated for bronchitis about 2 months ago with antibiotics and steroids.  He did endorse improvement of symptoms after the treatment, but notes that symptoms seem to return about 3 days ago.  Patient denies any interim fevers, chills, or sweats.  He also denies any nausea, vomiting, diarrhea.  History reviewed. No pertinent past medical history.  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 Authorizing Provider  albuterol (VENTOLIN HFA) 108 (90 Base) MCG/ACT inhaler Inhale 2 puffs into the lungs every 4 (four) hours as needed for wheezing or shortness of breath. 04/08/21   Kristyna Bradstreet, Charlesetta Ivory, PA-C    Allergies Patient has no known allergies.  History reviewed. No pertinent family history.  Social History Social History    Tobacco Use   Smoking status: Every Day   Smokeless tobacco: Never   Tobacco comments:    2-3/day   Substance Use Topics   Alcohol use: Yes   Drug use: Not Currently    Review of Systems  Constitutional: No fever/chills Eyes: No visual changes. ENT: No sore throat. Cardiovascular: Denies chest pain. Respiratory: Denies shortness of breath.  Reports cough as above.  Notes some left-sided chest pain with inspiration. Gastrointestinal: No abdominal pain.  No nausea, no vomiting.  No diarrhea.  No constipation. Genitourinary: Negative for dysuria. Musculoskeletal: Negative for back pain. Skin: Negative for rash. Neurological: Negative for headaches, focal weakness or numbness. ____________________________________________   PHYSICAL EXAM:  VITAL SIGNS: ED Triage Vitals  Enc Vitals Group     BP 04/08/21 1733 (!) 151/79     Pulse Rate 04/08/21 1733 65     Resp 04/08/21 1733 18     Temp 04/08/21 1733 98.4 F (36.9 C)     Temp Source 04/08/21 1733 Oral     SpO2 04/08/21 1733 97 %     Weight 04/08/21 1703 240 lb 1.3 oz (108.9 kg)     Height 04/08/21 1703 6' (1.829 m)     Head Circumference --      Peak Flow --      Pain Score 04/08/21 1703 0     Pain Loc --      Pain Edu? --      Excl. in GC? --     Constitutional: Alert and oriented. Well appearing and in no acute distress. Eyes:  Conjunctivae are normal. PERRL. EOMI and fundi normal bilaterally. Head: Atraumatic. Neck: No stridor.   Cardiovascular: Normal rate, regular rhythm. Grossly normal heart sounds.  Good peripheral circulation. Respiratory: Normal respiratory effort.  No retractions. Lungs CTAB. Gastrointestinal: Soft and nontender. No distention. No abdominal bruits. No CVA tenderness. Musculoskeletal: No lower extremity tenderness nor edema.  No joint effusions. Neurologic:  Normal speech and language. No gross focal neurologic deficits are appreciated. No gait instability. Skin:  Skin is warm, dry and  intact. No rash noted. Psychiatric: Mood and affect are normal. Speech and behavior are normal.  ____________________________________________   LABS (all labs ordered are listed, but only abnormal results are displayed)  Labs Reviewed - No data to display ____________________________________________  EKG  NSR with sinus arrhythmia Vent. rate 63 BPM PR interval 144 ms QRS duration 96 ms QT/QTcB 388/397 ms P-R-T axes 66 1 15 No STEMI ____________________________________________  RADIOLOGY I, Lissa Hoard, personally viewed and evaluated these images (plain radiographs) as part of my medical decision making, as well as reviewing the written report by the radiologist.  ED MD interpretation:  agree with report  Official radiology report(s): DG Chest 2 View  Result Date: 04/08/2021 CLINICAL DATA:  Cough and left-sided chest pain, initial encounter EXAM: CHEST - 2 VIEW COMPARISON:  01/25/2021 FINDINGS: The heart size and mediastinal contours are within normal limits. Both lungs are clear. The visualized skeletal structures are unremarkable. IMPRESSION: No active cardiopulmonary disease. Electronically Signed   By: Alcide Clever M.D.   On: 04/08/2021 17:56    ____________________________________________   PROCEDURES  Procedure(s) performed (including Critical Care):  Procedures   ____________________________________________   INITIAL IMPRESSION / ASSESSMENT AND PLAN / ED COURSE  As part of my medical decision making, I reviewed the following data within the electronic MEDICAL RECORD NUMBER EKG interpreted NSR, no axis deviation, and no ST segment changes, Radiograph reviewed WNL, and Notes from prior ED visits    Differential includes, but is not limited to, viral syndrome, bronchitis including COPD exacerbation, pneumonia, reactive airway disease including asthma, CHF including exacerbation with or without pulmonary/interstitial edema, pneumothorax, ACS, thoracic  trauma, and pulmonary embolism.  Patient ED evaluation of symptoms of some left-sided chest wall pain and some intermittent cough.  Patient's exam is overall benign and reassuring at this time.  Vital signs been stable without any signs of tachypnea or tachycardia.  Chest x-ray did not reveal any acute thoracic process at this time.  EKG is also without any indication of malignant arrhythmia or STEMI.  Clinically the patient not appear to have any acute infectious process at this time he is talking in complete sentences and has what appears to be reproducible chest wall pain which is stable at this time.  He will be discharged with instructions to take over-the-counter anti-inflammatories and a prescription for an albuterol inhaler as provided.  He is referred to [facility] clinic for routine medical care.  He should return to the ED if needed. ____________________________________________   FINAL CLINICAL IMPRESSION(S) / ED DIAGNOSES  Final diagnoses:  Chest wall pain     ED Discharge Orders          Ordered    albuterol (VENTOLIN HFA) 108 (90 Base) MCG/ACT inhaler  Every 4 hours PRN        04/08/21 1828             Note:  This document was prepared using Dragon voice recognition software and may include unintentional dictation errors.  Lissa Hoard, PA-C 04/08/21 1914    Georga Hacking, MD 04/15/21 [phone]

## 2021-04-08 NOTE — Discharge Instructions (Addendum)
Your exam, EKG, and chest ray all normal and reassuring at this time.  No signs of a serious infection of the lungs or concerns for heart attack or bronchitis.  Use the inhaler as needed, take over-the-counter Tylenol or Motrin as needed for pain.  Follow-up with primary provider or return to the ED if needed.

## 2021-07-10 ENCOUNTER — Emergency Department
Admission: EM | Admit: 2021-07-10 | Discharge: 2021-07-10 | Disposition: A | Discharge status: Home / Self Care | Payer: Self-pay | Attending: Emergency Medicine | Admitting: Emergency Medicine

## 2021-07-10 ENCOUNTER — Other Ambulatory Visit: Payer: Self-pay

## 2021-07-10 ENCOUNTER — Emergency Department: Payer: Self-pay

## 2021-07-10 DIAGNOSIS — Z20822 Contact with and (suspected) exposure to covid-19: Secondary | ICD-10-CM | POA: Insufficient documentation

## 2021-07-10 DIAGNOSIS — F17210 Nicotine dependence, cigarettes, uncomplicated: Secondary | ICD-10-CM | POA: Insufficient documentation

## 2021-07-10 DIAGNOSIS — J101 Influenza due to other identified influenza virus with other respiratory manifestations: Secondary | ICD-10-CM | POA: Insufficient documentation

## 2021-07-10 LAB — RESP PANEL BY RT-PCR (FLU A&B, COVID) ARPGX2
Influenza A by PCR: POSITIVE — AB
Influenza B by PCR: NEGATIVE
SARS Coronavirus 2 by RT PCR: NEGATIVE

## 2021-07-10 MED ORDER — IBUPROFEN 400 MG PO TABS
400.0000 mg | ORAL_TABLET | Freq: Once | ORAL | Status: AC
  Administered 2021-07-10: 400 mg via ORAL
  Filled 2021-07-10: qty 1

## 2021-07-10 NOTE — ED Provider Notes (Signed)
[facility]  ____________________________________________   Event Date/Time   First MD Initiated Contact with Patient 07/10/21 1608     (approximate)  I have reviewed the triage vital signs and the nursing notes.   HISTORY  Chief Complaint Fever    HPI Derrick Hopkins is a 50 y.o. male with no significant past medical history presents with body aches.  Symptoms started yesterday.  He endorses diffuse myalgias, decreased appetite congestion and cough.  Denies shortness of breath or chest pain.  Denies abdominal pain nausea vomiting or diarrhea.  No other sick contacts.  He has not had fever at home.         History reviewed. No pertinent past medical history.  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 Authorizing Provider  albuterol (VENTOLIN HFA) 108 (90 Base) MCG/ACT inhaler Inhale 2 puffs into the lungs every 4 (four) hours as needed for wheezing or shortness of breath. 04/08/21   Menshew, Charlesetta Ivory, PA-C    Allergies Patient has no known allergies.  No family history on file.  Social History Social History   Tobacco Use   Smoking status: Every Day   Smokeless tobacco: Never   Tobacco comments:    2-3/day   Substance Use Topics   Alcohol use: Yes   Drug use: Not Currently    Review of Systems   Review of Systems  Constitutional:  Negative for appetite change.  Respiratory:  Positive for cough. Negative for shortness of breath.   Cardiovascular:  Negative for chest pain.   Gastrointestinal:  Negative for abdominal pain, diarrhea and vomiting.  Musculoskeletal:  Positive for myalgias.  All other systems reviewed and are negative.  Physical Exam Updated Vital Signs BP 110/69 (BP Location: Left Arm)   Pulse 90   Temp (!) 100.8 F (38.2 C) (Oral)   Resp 20   Ht 6' (1.829 m)   Wt 106.6 kg   SpO2 96%   BMI 31.87 kg/m   Physical Exam Vitals and nursing note reviewed.  Constitutional:      General: He is not in acute distress.    Appearance: Normal appearance.  HENT:     Head: Normocephalic and atraumatic.  Eyes:     General: No scleral icterus.    Conjunctiva/sclera: Conjunctivae normal.  Cardiovascular:     Rate and Rhythm: Normal rate and regular rhythm.  Pulmonary:     Effort: Pulmonary effort is normal. No respiratory distress.     Breath sounds: Normal breath sounds. No wheezing.  Abdominal:     General: Abdomen is flat. There is no distension.     Palpations: Abdomen is soft.     Tenderness: There is no abdominal tenderness. There is no guarding.  Musculoskeletal:        General: No deformity or signs of injury.     Cervical back: Normal range of motion.  Skin:    Coloration: Skin is not jaundiced or pale.  Neurological:     General: No focal deficit present.  Mental Status: He is alert and oriented to person, place, and time. Mental status is at baseline.  Psychiatric:        Mood and Affect: Mood normal.        Behavior: Behavior normal.     LABS (all labs ordered are listed, but only abnormal results are displayed)  Labs Reviewed  RESP PANEL BY RT-PCR (FLU A&B, COVID) ARPGX2 - Abnormal; Notable for the following components:      Result Value   Influenza A by PCR POSITIVE (*)    All other components within normal limits   ____________________________________________  EKG  N/a ____________________________________________  RADIOLOGY Ky Barban, personally viewed and evaluated these images (plain  radiographs) as part of my medical decision making, as well as reviewing the written report by the radiologist.  ED MD interpretation:  I reviewed the CXR which does not show any acute cardiopulmonary process      ____________________________________________   PROCEDURES  Procedure(s) performed (including Critical Care):  Procedures   ____________________________________________   INITIAL IMPRESSION / ASSESSMENT AND PLAN / ED COURSE     50 year old male was otherwise healthy presents with viral type symptoms.  He has had myalgias nasal congestion cough for about a day and a half.  He is febrile to 100.8 here but otherwise vital signs are within normal limits.  He is very well-appearing.  Lungs are clear abdomen is benign.  He has no symptoms of dyspnea or chest pain etc.  He is positive for flu A.  Chest x-ray was obtained from triage which does not show any evidence of pneumonia.  We discussed supportive care including Tylenol and Motrin for his myalgias and return precautions for dyspnea and inability to tolerate p.o. Excedrin.  He is stable for discharge.      ____________________________________________   FINAL CLINICAL IMPRESSION(S) / ED DIAGNOSES  Final diagnoses:  Influenza A     ED Discharge Orders     None        Note:  This document was prepared using Dragon voice recognition software and may include unintentional dictation errors.    Georga Hacking, MD 07/10/21 [phone]

## 2021-07-10 NOTE — ED Provider Notes (Signed)
Emergency Medicine Provider Triage Evaluation Note  Derrick Hopkins , a 50 y.o. male  was evaluated in triage.  Pt complains of fever, chills.  Review of Systems  Positive: Fever, chills, cough, shortness of breath Negative: No vomiting/diarrhea  Physical Exam  BP 110/69 (BP Location: Left Arm)   Pulse 90   Temp (!) 100.8 F (38.2 C) (Oral)   Resp 20   Ht 6' (1.829 m)   Wt 106.6 kg   SpO2 96%   BMI 31.87 kg/m  Gen:   Awake, no distress   Resp:  Normal effort  MSK:   Moves extremities without difficulty  Other:    Medical Decision Making  Medically screening exam initiated at 2:27 PM.  Appropriate orders placed.  Derrick Hopkins was informed that the remainder of the evaluation will be completed by another provider, this initial triage assessment does not replace that evaluation, and the importance of remaining in the ED until their evaluation is complete.  Chest x-ray respiratory panel ordered   Derrick Ghee, PA-C 07/10/21 1428    Derrick Katos, MD 07/10/21 [phone]

## 2021-07-10 NOTE — ED Triage Notes (Signed)
Pt comes with c/o generalized body aches and fever that started yesterday. Pt states nose congestion and cough.

## 2022-01-06 ENCOUNTER — Other Ambulatory Visit: Payer: Self-pay

## 2022-01-06 ENCOUNTER — Emergency Department: Payer: 59

## 2022-01-06 ENCOUNTER — Emergency Department
Admission: EM | Admit: 2022-01-06 | Discharge: 2022-01-07 | Disposition: A | Discharge status: Home / Self Care | Payer: 59 | Attending: Emergency Medicine | Admitting: Emergency Medicine

## 2022-01-06 DIAGNOSIS — Y990 Civilian activity done for income or pay: Secondary | ICD-10-CM | POA: Insufficient documentation

## 2022-01-06 DIAGNOSIS — S0031XA Abrasion of nose, initial encounter: Secondary | ICD-10-CM | POA: Insufficient documentation

## 2022-01-06 DIAGNOSIS — W01198A Fall on same level from slipping, tripping and stumbling with subsequent striking against other object, initial encounter: Secondary | ICD-10-CM | POA: Insufficient documentation

## 2022-01-06 DIAGNOSIS — S0992XA Unspecified injury of nose, initial encounter: Secondary | ICD-10-CM | POA: Diagnosis present

## 2022-01-06 DIAGNOSIS — S8991XA Unspecified injury of right lower leg, initial encounter: Secondary | ICD-10-CM | POA: Diagnosis not present

## 2022-01-06 NOTE — ED Triage Notes (Signed)
Pt presents via POV c/o fall last night while on porch due to tripping. Hit nose and right knee. Ambulatory to triage.  ?

## 2022-01-06 NOTE — ED Notes (Signed)
No answer when called several times from lobby; no answer when phone # listed in chart called 

## 2022-01-07 MED ORDER — IBUPROFEN 800 MG PO TABS
800.0000 mg | ORAL_TABLET | Freq: Once | ORAL | Status: AC
  Administered 2022-01-07: 800 mg via ORAL
  Filled 2022-01-07: qty 1

## 2022-01-07 MED ORDER — ONDANSETRON 4 MG PO TBDP: 4.0000 mg | ORAL_TABLET | Freq: Four times a day (QID) | ORAL | 0 refills | Status: DC | PRN

## 2022-01-07 MED ORDER — HYDROCODONE-ACETAMINOPHEN 5-325 MG PO TABS: 2.0000 | ORAL_TABLET | Freq: Four times a day (QID) | ORAL | 0 refills | Status: DC | PRN

## 2022-01-07 MED ORDER — IBUPROFEN 800 MG PO TABS: 800.0000 mg | ORAL_TABLET | Freq: Three times a day (TID) | ORAL | 0 refills | Status: DC | PRN

## 2022-01-07 NOTE — Discharge Instructions (Addendum)

## 2022-01-07 NOTE — ED Provider Notes (Signed)
? ?[facility] ?Provider Note ? ? ? Event Date/Time  ? First MD Initiated Contact with Patient 01/07/22 0008   ?  (approximate) ? ? ?History  ? ?Fall ? ? ?HPI ? ?Derrick Hopkins is a 51 y.o. male with no significant past medical history who presents to the emergency department with right knee pain.  States he tripped on his porch last night falling onto his knee.  States he hit his nose against the railing.  No severe headache, neck pain, loss of consciousness.  Not on blood thinners.  States it hurts to ambulate he will hear his knee "pop".  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  He denies any numbness, tingling, weakness.  No nausea or vomiting.  No other injuries. ? ? ?History provided by patient. ? ? ? ?No past medical history on file. ? ?Past Surgical History:  ?Procedure Laterality Date  ? HERNIA REPAIR    ? ? ?MEDICATIONS:  ?Prior to Admission medications   ?Medication Sig Start Date End Date Taking? Authorizing Provider  ?albuterol (VENTOLIN HFA) 108 (90 Base) MCG/ACT inhaler Inhale 2 puffs into the lungs every 4 (four) hours as needed for wheezing or shortness of breath. 04/08/21   Menshew, Charlesetta Ivory, PA-C  ? ? ?Physical Exam  ? ?Triage Vital Signs: ?ED Triage Vitals  ?Enc Vitals Group  ?   BP 01/06/22 1943 (!) 145/69  ?   Pulse Rate 01/06/22 1943 67  ?   Resp 01/06/22 1943 16  ?   Temp 01/06/22 1943 98.4 ?F (36.9 ?C)  ?   Temp Source 01/06/22 1943 Oral  ?   SpO2 01/06/22 1943 96 %  ?   Weight 01/06/22 1945 235 lb (106.6 kg)  ?   Height 01/06/22 1945 6' (1.829 m)  ?   Head Circumference --   ?   Peak Flow --   ?   Pain Score 01/06/22 1945 5  ?   Pain Loc --   ?   Pain Edu? --   ?   Excl. in GC? --   ? ? ?Most recent vital signs: ?Vitals:  ? 01/06/22 1943 01/07/22 0039  ?BP: (!) 145/69   ?Pulse: 67 61  ?Resp: 16   ?Temp: 98.4 ?F (36.9 ?C)   ?SpO2: 96% 95%  ? ? ? ?CONSTITUTIONAL: Alert and oriented and responds appropriately to questions.  Well-appearing; well-nourished; GCS 15 ?HEAD: Normocephalic; atraumatic ?EYES: Conjunctivae clear, PERRL, EOMI ?ENT: normal nose; no rhinorrhea; moist mucous membranes; pharynx without lesions noted; no dental injury; no septal hematoma, no epistaxis; no facial deformity or bony tenderness, superficial abrasion to the bridge of the nose but no facial tenderness on exam ?NECK: Supple, no midline spinal tenderness, step-off or deformity; trachea midline ?CARD: RRR; S1 and S2 appreciated; no murmurs, no clicks, no rubs, no gallops ?RESP: Normal chest excursion without splinting or tachypnea; breath sounds clear and equal bilaterally; no wheezes, no rhonchi, no rales; no hypoxia or respiratory distress ?CHEST:  chest wall stable, no crepitus or ecchymosis or deformity, nontender to palpation; no flail chest ?ABD/GI: Normal bowel sounds; non-distended; soft, non-tender, no rebound, no guarding; no ecchymosis or other lesions noted ?PELVIS:  stable, nontender to palpation ?BACK:  The back appears normal; no midline spinal tenderness, step-off or deformity ?EXT: Patient has some tenderness over the anterior right knee without deformity or joint effusion.  No ligamentous laxity.  Extremities warm and well perfused.  Normal ROM in all joints; otherwise extremities are non-tender to palpation; no edema; normal capillary refill; no cyanosis, no bony tenderness or bony deformity of patient's extremities, no joint effusion, compartments are soft, no ecchymosis ?SKIN: Normal color for age and race; warm ?NEURO: No facial asymmetry, normal speech, moving all extremities equally, ambulates with normal gait ? ?ED Results / Procedures / Treatments  ? ?LABS: ?(all labs ordered are listed, but only abnormal results are displayed) ?Labs Reviewed - No data to display ? ? ?EKG: ? ? ?RADIOLOGY: ?My personal review and interpretation of imaging: X-ray shows arthritic changes but no fracture or dislocation. ? ?I have personally reviewed all  radiology reports. ?DG Knee Complete 4 Views Right ? ?Result Date: 01/06/2022 ?CLINICAL DATA:  Fall EXAM: RIGHT KNEE - COMPLETE 4+ VIEW COMPARISON:  None Available. FINDINGS: Degenerative changes in the right knee with joint space narrowing and spurring. No joint effusion. No acute bony abnormality. Specifically, no fracture, subluxation, or dislocation. IMPRESSION: Tricompartment degenerative changes.  No acute bony abnormality. Electronically Signed   By: Charlett NoseKevin  Dover M.D.   On: 01/06/2022 20:09   ? ? ?PROCEDURES: ? ?Critical Care performed: No ? ? ? ? ?Procedures ? ? ? ?IMPRESSION / MDM / ASSESSMENT AND PLAN / ED COURSE  ?I reviewed the triage vital signs and the nursing notes. ? ?Patient here with mechanical fall with right knee pain. ? ? ? ?DIFFERENTIAL DIAGNOSIS (includes but not limited to):   Right knee sprain, right knee contusion, fracture, dislocation, doubt facial fractures, skull fracture, intracranial hemorrhage ? ? ?PLAN: We will obtain x-rays of the right knee.  He drove himself to the emergency department and would like to drive home.  Will give ibuprofen here. ? ? ?MEDICATIONS GIVEN IN ED: ?Medications  ?ibuprofen (ADVIL) tablet 800 mg (800 mg Oral Given 01/07/22 0040)  ? ? ? ?ED COURSE: X-ray reviewed and interpreted by myself and radiology and shows no fracture or dislocation.  Per [place] head CT rules patient does not need a head CT at this time.  I also do not feel he broke any bones in his face and I do not feel he needs a CT of the face currently.  We will wrap his knee in an Ace wrap for comfort and recommended rest, elevation and ice.  Will provide with work note and short course of analgesia.  Given outpatient orthopedic follow-up symptoms not improving with conservative management. ? ? ?At this time, I do not feel there is any life-threatening condition present. I reviewed all nursing notes, vitals, pertinent previous records.  All lab and urine results, EKGs, imaging ordered have been  independently reviewed and interpreted by myself.  I reviewed all available radiology reports from any imaging ordered this visit.  Based on my assessment, I feel the patient is safe to be discharged home without further emergent workup and can continue workup as an outpatient as needed. Discussed all findings, treatment plan as well as usual and customary return precautions with patient.  They verbalize understanding and are comfortable with this plan.  Outpatient follow-up has been provided as needed.  All questions have been answered. ? ? ? ?CONSULTS: No emergent orthopedic consult needed at this time.  No fracture or dislocation.  No sign of septic arthritis, compartment syndrome, arterial obstruction, DVT. ? ? ?OUTSIDE RECORDS REVIEWED: No previous records for review. ? ? ? ? ? ? ? ? ?FINAL CLINICAL IMPRESSION(S) / ED DIAGNOSES  ? ?Final  diagnoses:  ?Injury of right knee, initial encounter  ?Abrasion, nose w/o infection  ? ? ? ?Rx / DC Orders  ? ?ED Discharge Orders   ? ?      Ordered  ?  ibuprofen (ADVIL) 800 MG tablet  Every 8 hours PRN       ? 01/07/22 0026  ?  HYDROcodone-acetaminophen (NORCO/VICODIN) 5-325 MG tablet  Every 6 hours PRN       ? 01/07/22 0026  ?  ondansetron (ZOFRAN-ODT) 4 MG disintegrating tablet  Every 6 hours PRN       ? 01/07/22 0026  ? ?  ?  ? ?  ? ? ? ?Note:  This document was prepared using Dragon voice recognition software and may include unintentional dictation errors. ?  ?Gottlieb Zuercher, Layla Maw, DO ?01/07/22 0328 ? ?

## 2022-01-07 NOTE — ED Notes (Signed)
Pt declined to have Discharge vitals at this time. States he has a long drive home. 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

## 2022-04-03 ENCOUNTER — Emergency Department
Admission: EM | Admit: 2022-04-03 | Discharge: 2022-04-03 | Disposition: A | Discharge status: Home / Self Care | Payer: 59 | Attending: Emergency Medicine | Admitting: Emergency Medicine

## 2022-04-03 ENCOUNTER — Other Ambulatory Visit: Payer: Self-pay

## 2022-04-03 ENCOUNTER — Emergency Department: Payer: 59

## 2022-04-03 ENCOUNTER — Encounter: Payer: Self-pay | Admitting: Emergency Medicine

## 2022-04-03 DIAGNOSIS — Y9389 Activity, other specified: Secondary | ICD-10-CM | POA: Insufficient documentation

## 2022-04-03 DIAGNOSIS — M7022 Olecranon bursitis, left elbow: Secondary | ICD-10-CM

## 2022-04-03 MED ORDER — CEPHALEXIN 500 MG PO CAPS: 500.0000 mg | ORAL_CAPSULE | Freq: Four times a day (QID) | ORAL | 0 refills | Status: AC

## 2022-04-03 NOTE — ED Triage Notes (Signed)
Pt to ED via POV c/o abscess on his left elbow. Elbow is draining at this time. Pt is in NAD.

## 2022-04-03 NOTE — ED Notes (Addendum)
See triage note  Presents with some swelling to left elbow  area is slightly warm to touch  and now draining

## 2022-04-03 NOTE — Discharge Instructions (Signed)
Please take the antibiotics as prescribed.  Rest, ice, elevate your arm.  Please return for any new, worsening, or change in symptoms or other concerns.  Please follow-up with orthopedics, call their office tomorrow to arrange an appointment.

## 2022-04-03 NOTE — ED Provider Notes (Signed)
[facility] Provider Note    Event Date/Time   First MD Initiated Contact with Patient 04/03/22 1735     (approximate)   History   Abscess   HPI  Derrick Hopkins is a 51 y.o. male who presents today for evaluation of left elbow swelling and drainage.  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  He did not notice any pain, though noticed wetness which is what alerted him to the abnormality.  He denies any pain with moving his elbow.  He denies fevers or chills.  He has not noticed any skin color changes.  He reports that the liquid is "like water."     Physical Exam   Triage Vital Signs: ED Triage Vitals  Enc Vitals Group     BP 04/03/22 1721 (!) 141/74     Pulse Rate 04/03/22 1721 65     Resp 04/03/22 1721 18     Temp 04/03/22 1721 98.5 F (36.9 C)     Temp Source 04/03/22 1721 Oral     SpO2 04/03/22 1721 98 %     Weight 04/03/22 1758 235 lb 0.2 oz (106.6 kg)     Height 04/03/22 1758 6' (1.829 m)     Head Circumference --      Peak Flow --      Pain Score 04/03/22 1538 5     Pain Loc --      Pain Edu? --      Excl. in GC? --     Most recent vital signs: Vitals:   04/03/22 1733 04/03/22 1915  BP: (!) 144/76 132/87  Pulse: (!) 58 72  Resp: 16 18  Temp: 98.2 F (36.8 C) 98.6 F (37 C)  SpO2: 98% 98%    Physical Exam Vitals and nursing note reviewed.  Constitutional:      General: Awake and alert. No acute distress.    Appearance: Normal appearance. The patient is normal weight.  HENT:     Head: Normocephalic and atraumatic.     Mouth: Mucous membranes are moist.  Eyes:     General: PERRL. Normal EOMs        Right eye: No discharge.        Left eye: No discharge.     Conjunctiva/sclera: Conjunctivae normal.  Cardiovascular:     Rate and Rhythm: Normal rate and regular rhythm.     Pulses: Normal pulses.  Pulmonary:     Effort: Pulmonary effort is  normal. No respiratory distress.  Abdominal:     Abdomen is soft. There is no abdominal tenderness. Musculoskeletal:        General: No swelling. Normal range of motion.     Cervical back: Normal range of motion and neck supple.  Left elbow: Swelling over the olecranon with small 0.5 cm open area draining serous fluid.  Patient has full normal range of motion of his elbow, no pain with axial loading.  Able to flex and extend fully.  Able to supinate and pronate normally.  There is no warmth or erythema to this area Skin:    General: Skin is warm and dry.     Capillary Refill: Capillary refill takes less than 2 seconds.     Findings: No rash.  Neurological:     Mental Status: The patient is awake and alert.      ED Results / Procedures / Treatments  Labs (all labs ordered are listed, but only abnormal results are displayed) Labs Reviewed - No data to display   EKG     RADIOLOGY I independently reviewed and interpreted imaging and agree with radiologists findings.    PROCEDURES:  Critical Care performed:   Procedures   MEDICATIONS ORDERED IN ED: Medications - No data to display   IMPRESSION / MDM / ASSESSMENT AND PLAN / ED COURSE  I reviewed the triage vital signs and the nursing notes.   Differential diagnosis includes, but is not limited to, bursitis, septic bursitis, less likely septic joint, or abscess.  Patient is awake and alert, hemodynamically stable and afebrile.  He is able to range his elbow fully and painlessly, do not suspect septic joint.  There is no overlying warmth or erythema, and the drainage is serous rather than purulent, do not suspect septic bursitis.  Given the high risk for infection given opening to his bursa, he was started on prophylactic antibiotics.  He was placed in a compression dressing and instructed to follow-up with orthopedics, he was given the appropriate information.  We discussed return precautions and outpatient follow-up.   Patient understands and agrees with plan.  Discharged in stable condition.   Patient's presentation is most consistent with acute complicated illness / injury requiring diagnostic workup.    FINAL CLINICAL IMPRESSION(S) / ED DIAGNOSES   Final diagnoses:  Olecranon bursitis of left elbow     Rx / DC Orders   ED Discharge Orders          Ordered    cephALEXin (KEFLEX) 500 MG capsule  4 times daily        04/03/22 1845             Note:  This document was prepared using Dragon voice recognition software and may include unintentional dictation errors.   Keturah Shavers 04/03/22 2100    Chesley Noon, MD 04/05/22 [id number]

## 2022-05-07 DIAGNOSIS — M7022 Olecranon bursitis, left elbow: Secondary | ICD-10-CM | POA: Diagnosis not present

## 2022-05-07 DIAGNOSIS — M25522 Pain in left elbow: Secondary | ICD-10-CM | POA: Diagnosis not present

## 2022-05-20 DIAGNOSIS — M7022 Olecranon bursitis, left elbow: Secondary | ICD-10-CM | POA: Diagnosis not present

## 2022-05-21 DIAGNOSIS — M7022 Olecranon bursitis, left elbow: Secondary | ICD-10-CM | POA: Diagnosis not present

## 2022-06-05 NOTE — H&P (Signed)
PREOPERATIVE H&P  Chief Complaint: M70.22 Olecranon bursitis, left elbow  HPI: Derrick Hopkins is a 51 y.o. male who presents for preoperative history and physical with a diagnosis of M70.22 Olecranon bursitis, left elbow.  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  There is no sign of infection.  This interferes with activities of daily living and he gets sick constantly.  Symptoms are rated as moderate to severe, and have been worsening.  This is significantly impairing activities of daily living.  He has elected for surgical management.   No past medical history on file. Past Surgical History:  Procedure Laterality Date   HERNIA REPAIR     Social History   Socioeconomic History   Marital status: Single    Spouse name: Not on file   Number of children: Not on file   Years of education: Not on file   Highest education level: Not on file  Occupational History   Not on file  Tobacco Use   Smoking status: Every Day   Smokeless tobacco: Never   Tobacco comments:    2-3/day   Substance and Sexual Activity   Alcohol use: Yes   Drug use: Not Currently   Sexual activity: Not on file  Other Topics Concern   Not on file  Social History Narrative   Not on file   Social Determinants of Health   Financial Resource Strain: Not on file  Food Insecurity: Not on file  Transportation Needs: Not on file  Physical Activity: Not on file  Stress: Not on file  Social Connections: Not on file   No family history on file. No Known Allergies Prior to Admission medications   Medication Sig Start Date End Date Taking? Authorizing Provider  albuterol (VENTOLIN HFA) 108 (90 Base) MCG/ACT inhaler Inhale 2 puffs into the lungs every 4 (four) hours as needed for wheezing or shortness of breath. 04/08/21   Menshew, Dannielle Karvonen, PA-C     Positive ROS: All other systems have been reviewed and were otherwise negative with the exception of those mentioned in the HPI and as  above.  Physical Exam: General: Alert, no acute distress Cardiovascular: No pedal edema. Heart is regular and without murmur.  Respiratory: No cyanosis, no use of accessory musculature. Lungs are clear. GI: No organomegaly, abdomen is soft and non-tender Skin: No lesions in the area of chief complaint Neurologic: Sensation intact distally Psychiatric: Patient is competent for consent with normal mood and affect Lymphatic: No axillary or cervical lymphadenopathy  MUSCULOSKELETAL: There is a very large swollen olecranon bursa on the left.  It is hard and to the touch and filled with tophaceous gouty material.  Attempts at aspirin and it has been unsuccessful.  Range of motion the elbow is normal.  The skin is intact.  Neurovascular status good distally.  Assessment: M70.22 Olecranon bursitis, left elbow  Plan: Plan for Procedure(s): OLECRANON (ELBOW) BURSA--excision on the left.  The risks benefits and alternatives were discussed with the patient including but not limited to the risks of nonoperative treatment, versus surgical intervention including infection, bleeding, nerve injury,  blood clots, cardiopulmonary complications, morbidity, mortality, among others, and they were willing to proceed.   Park Breed, MD [phone]   06/05/2022 10:06 AM

## 2022-06-11 ENCOUNTER — Encounter
Admission: RE | Admit: 2022-06-11 | Discharge: 2022-06-11 | Disposition: A | Discharge status: Home / Self Care | Payer: 59 | Source: Ambulatory Visit | Attending: Specialist | Admitting: Specialist

## 2022-06-11 VITALS — Ht 72.0 in | Wt 235.0 lb

## 2022-06-11 DIAGNOSIS — I10 Essential (primary) hypertension: Secondary | ICD-10-CM

## 2022-06-11 DIAGNOSIS — Z01818 Encounter for other preprocedural examination: Secondary | ICD-10-CM

## 2022-06-11 DIAGNOSIS — E119 Type 2 diabetes mellitus without complications: Secondary | ICD-10-CM

## 2022-06-11 HISTORY — DX: Olecranon bursitis, unspecified elbow: M70.20

## 2022-06-11 HISTORY — DX: Other complications of anesthesia, initial encounter: T88.59XA

## 2022-06-11 HISTORY — DX: Gastro-esophageal reflux disease without esophagitis: K21.9

## 2022-06-11 HISTORY — DX: Encounter for general psychiatric examination, requested by authority: Z04.6

## 2022-06-11 HISTORY — DX: Unspecified osteoarthritis, unspecified site: M19.90

## 2022-06-11 HISTORY — DX: Tobacco use: Z72.0

## 2022-06-11 HISTORY — DX: Personal history of other mental and behavioral disorders: Z86.59

## 2022-06-11 HISTORY — DX: Other psychoactive substance use, unspecified with psychoactive substance-induced mood disorder: F19.94

## 2022-06-11 HISTORY — DX: Type 2 diabetes mellitus without complications: E11.9

## 2022-06-11 HISTORY — DX: Essential (primary) hypertension: I10

## 2022-06-11 HISTORY — DX: Alcohol abuse, uncomplicated: F10.10

## 2022-06-11 NOTE — Patient Instructions (Signed)
Your procedure is scheduled on:06-19-22 Thursday  Report to the Registration Desk on the 1st floor of the [facility].Then proceed to the 2nd floor Surgery Desk To find out your arrival time, please call [phone] between 1PM - 3PM on:06-18-22 Wednesday If your arrival time is 6:00 am, do not arrive prior to that time as the [place] entrance doors do not open until 6:00 am.  REMEMBER: Instructions that are not followed completely may result in serious medical risk, up to and including death; or upon the discretion of your surgeon and anesthesiologist your surgery may need to be rescheduled.  Do not eat food OR drink any liquids after midnight the night before surgery.  No gum chewing, lozengers or hard candies.  Do NOT take any medication the day of surgery  Stop your metFORMIN (GLUCOPHAGE) 2 days prior to surgery-Last dose will be on 06-16-22 Monday  One week prior to surgery: Stop Anti-inflammatories (NSAIDS) such as Advil, Aleve, Ibuprofen, Motrin, Naproxen, Naprosyn and Aspirin based products such as Excedrin, Goodys Powder, BC Powder.You may however, continue to take Tylenol if needed for pain up until the day of surgery.  Stop ANY OVER THE COUNTER supplements/vitamins NOW (06-11-22) until after surgery.  No Alcohol for 24 hours before or after surgery.  No Smoking including e-cigarettes for 24 hours prior to surgery.  No chewable tobacco products for at least 6 hours prior to surgery.  No nicotine patches on the day of surgery.  Do not use any "recreational" drugs for at least a week prior to your surgery.  Please be advised that the combination of cocaine and anesthesia may have negative outcomes, up to and including death. If you test positive for cocaine, your surgery will be cancelled.  On the morning of surgery brush your teeth with toothpaste and water, you may rinse your mouth with mouthwash if you wish. Do not swallow any toothpaste or mouthwash.  Use CHG  Soap as directed on instruction sheet.  Do not wear jewelry, make-up, hairpins, clips or nail polish.  Do not wear lotions, powders, or perfumes.   Do not shave body from the neck down 48 hours prior to surgery just in case you cut yourself which could leave a site for infection.  Also, freshly shaved skin may become irritated if using the CHG soap.  Contact lenses, hearing aids and dentures may not be worn into surgery.  Do not bring valuables to the hospital. [facility] is not responsible for any missing/lost belongings or valuables.   Notify your doctor if there is any change in your medical condition (cold, fever, infection).  Wear comfortable clothing (specific to your surgery type) to the hospital.  After surgery, you can help prevent lung complications by doing breathing exercises.  Take deep breaths and cough every 1-2 hours. Your doctor may order a device called an Incentive Spirometer to help you take deep breaths. When coughing or sneezing, hold a pillow firmly against your incision with both hands. This is called "splinting." Doing this helps protect your incision. It also decreases belly discomfort.  If you are being admitted to the hospital overnight, leave your suitcase in the car. After surgery it may be brought to your room.  If you are being discharged the day of surgery, you will not be allowed to drive home. You will need a responsible adult (18 years or older) to drive you home and stay with you that night.   If you are taking public transportation, you will  need to have a responsible adult (18 years or older) with you. Please confirm with your physician that it is acceptable to use public transportation.   Please call the [place] Dept. at [phone] if you have any questions about these instructions.  Surgery Visitation Policy:  Patients undergoing a surgery or procedure may have two family members or support persons with them as long as the  person is not COVID-19 positive or experiencing its symptoms.

## 2022-06-13 ENCOUNTER — Encounter
Admission: RE | Admit: 2022-06-13 | Discharge: 2022-06-13 | Disposition: A | Discharge status: Home / Self Care | Payer: 59 | Source: Ambulatory Visit | Attending: Specialist | Admitting: Specialist

## 2022-06-13 ENCOUNTER — Inpatient Hospital Stay: Admission: RE | Admit: 2022-06-13 | Payer: 59 | Source: Ambulatory Visit

## 2022-06-13 ENCOUNTER — Encounter: Payer: Self-pay | Admitting: Urgent Care

## 2022-06-13 DIAGNOSIS — I10 Essential (primary) hypertension: Secondary | ICD-10-CM | POA: Diagnosis not present

## 2022-06-13 DIAGNOSIS — Z01810 Encounter for preprocedural cardiovascular examination: Secondary | ICD-10-CM | POA: Diagnosis not present

## 2022-06-13 DIAGNOSIS — E119 Type 2 diabetes mellitus without complications: Secondary | ICD-10-CM | POA: Diagnosis not present

## 2022-06-13 DIAGNOSIS — Z01818 Encounter for other preprocedural examination: Secondary | ICD-10-CM | POA: Diagnosis not present

## 2022-06-13 LAB — HEMOGLOBIN A1C
Hgb A1c MFr Bld: 5.7 % — ABNORMAL HIGH (ref 4.8–5.6)
Mean Plasma Glucose: 116.89 mg/dL

## 2022-06-30 ENCOUNTER — Encounter
Admission: RE | Admit: 2022-06-30 | Discharge: 2022-06-30 | Disposition: A | Discharge status: Home / Self Care | Payer: 59 | Source: Ambulatory Visit | Attending: Specialist | Admitting: Specialist

## 2022-06-30 DIAGNOSIS — L309 Dermatitis, unspecified: Secondary | ICD-10-CM

## 2022-06-30 HISTORY — DX: Dermatitis, unspecified: L30.9

## 2022-06-30 NOTE — Patient Instructions (Addendum)
Your procedure is scheduled on:07-04-22 Friday Report to the Registration Desk on the 1st floor of the [facility].Then proceed to the 2nd floor Surgery Desk  To find out your arrival time, please call [phone] between 1PM - 3PM on:07-03-22 Thursday If your arrival time is 6:00 am, do not arrive prior to that time as the [place] entrance doors do not open until 6:00 am.  REMEMBER: Instructions that are not followed completely may result in serious medical risk, up to and including death; or upon the discretion of your surgeon and anesthesiologist your surgery may need to be rescheduled.  Do not eat food OR drink any liquids after midnight the night before surgery.  No gum chewing, lozengers or hard candies.  Do NOT take any medication the day of surgery  Stop your metFORMIN (GLUCOPHAGE) 2 days prior to surgery-Last dose will be on 07-01-22 Tuesday  One week prior to surgery: Stop Anti-inflammatories (NSAIDS) such as Advil, Aleve, Ibuprofen, Motrin, Naproxen, Naprosyn and Aspirin based products such as Excedrin, Goodys Powder, BC Powder.You may however,  take Tylenol if needed for pain up until the day of surgery.  Stop ANY OVER THE COUNTER supplements/vitamins NOW (06-30-22) until after surgery.  No Alcohol for 24 hours before or after surgery.  No Smoking including e-cigarettes for 24 hours prior to surgery.  No chewable tobacco products for at least 6 hours prior to surgery.  No nicotine patches on the day of surgery.  Do not use any "recreational" drugs for at least a week prior to your surgery.  Please be advised that the combination of cocaine and anesthesia may have negative outcomes, up to and including death. If you test positive for cocaine, your surgery will be cancelled.  On the morning of surgery brush your teeth with toothpaste and water, you may rinse your mouth with mouthwash if you wish. Do not swallow any toothpaste or mouthwash.  Do not wear jewelry,  make-up, hairpins, clips or nail polish.  Do not wear lotions, powders, or perfumes.   Do not shave body from the neck down 48 hours prior to surgery just in case you cut yourself which could leave a site for infection.  Also, freshly shaved skin may become irritated if using the CHG soap.  Contact lenses, hearing aids and dentures may not be worn into surgery.  Do not bring valuables to the hospital. [facility] is not responsible for any missing/lost belongings or valuables.   Notify your doctor if there is any change in your medical condition (cold, fever, infection).  Wear comfortable clothing (specific to your surgery type) to the hospital.  After surgery, you can help prevent lung complications by doing breathing exercises.  Take deep breaths and cough every 1-2 hours. Your doctor may order a device called an Incentive Spirometer to help you take deep breaths. When coughing or sneezing, hold a pillow firmly against your incision with both hands. This is called "splinting." Doing this helps protect your incision. It also decreases belly discomfort.  If you are being admitted to the hospital overnight, leave your suitcase in the car. After surgery it may be brought to your room.  If you are being discharged the day of surgery, you will not be allowed to drive home. You will need a responsible adult (18 years or older) to drive you home and stay with you that night.   If you are taking public transportation, you will need to have a responsible adult (18 years or older)  with you. Please confirm with your physician that it is acceptable to use public transportation.   Please call the [place] Dept. at [phone] if you have any questions about these instructions.  Surgery Visitation Policy:  Patients undergoing a surgery or procedure may have two family members or support persons with them as long as the person is not COVID-19 positive or experiencing its symptoms.

## 2022-07-04 ENCOUNTER — Ambulatory Visit: Admission: RE | Admit: 2022-07-04 | Payer: 59 | Source: Home / Self Care | Admitting: Specialist

## 2022-07-04 ENCOUNTER — Encounter: Admission: RE | Payer: Self-pay | Source: Home / Self Care

## 2022-07-04 SURGERY — BURSECTOMY, ELBOW
Anesthesia: General | Site: Elbow | Laterality: Left

## 2022-08-10 IMAGING — CR DG CHEST 2V
1 series · 2 of 2 positions shown · non-contrast
Comparison: 04/08/2021

CLINICAL DATA: Cough, fever

EXAM:
CHEST - 2 VIEW

[Series 1: dg chest 2 view · 0.14mm/px · 2 of 2 slices shown]
[im 1/2]
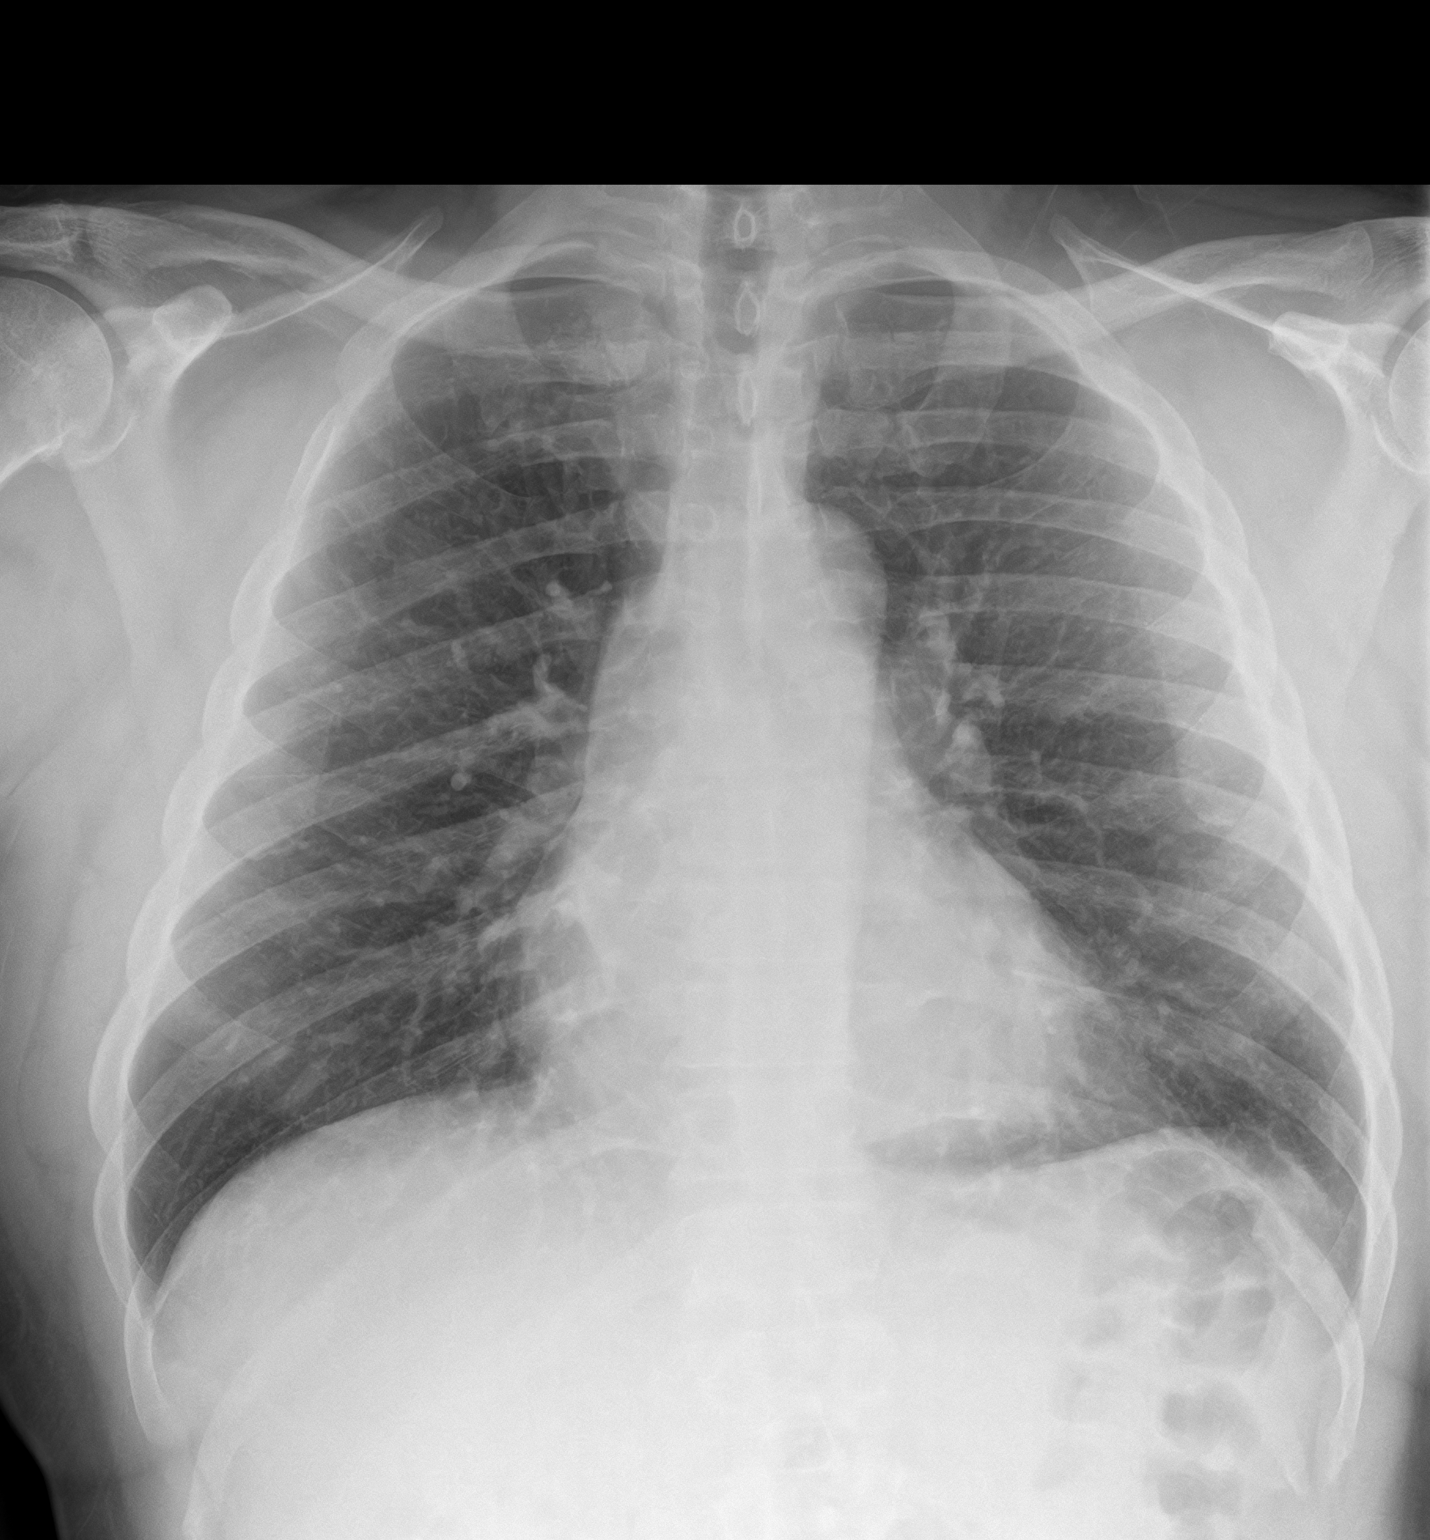
[im 2/2]
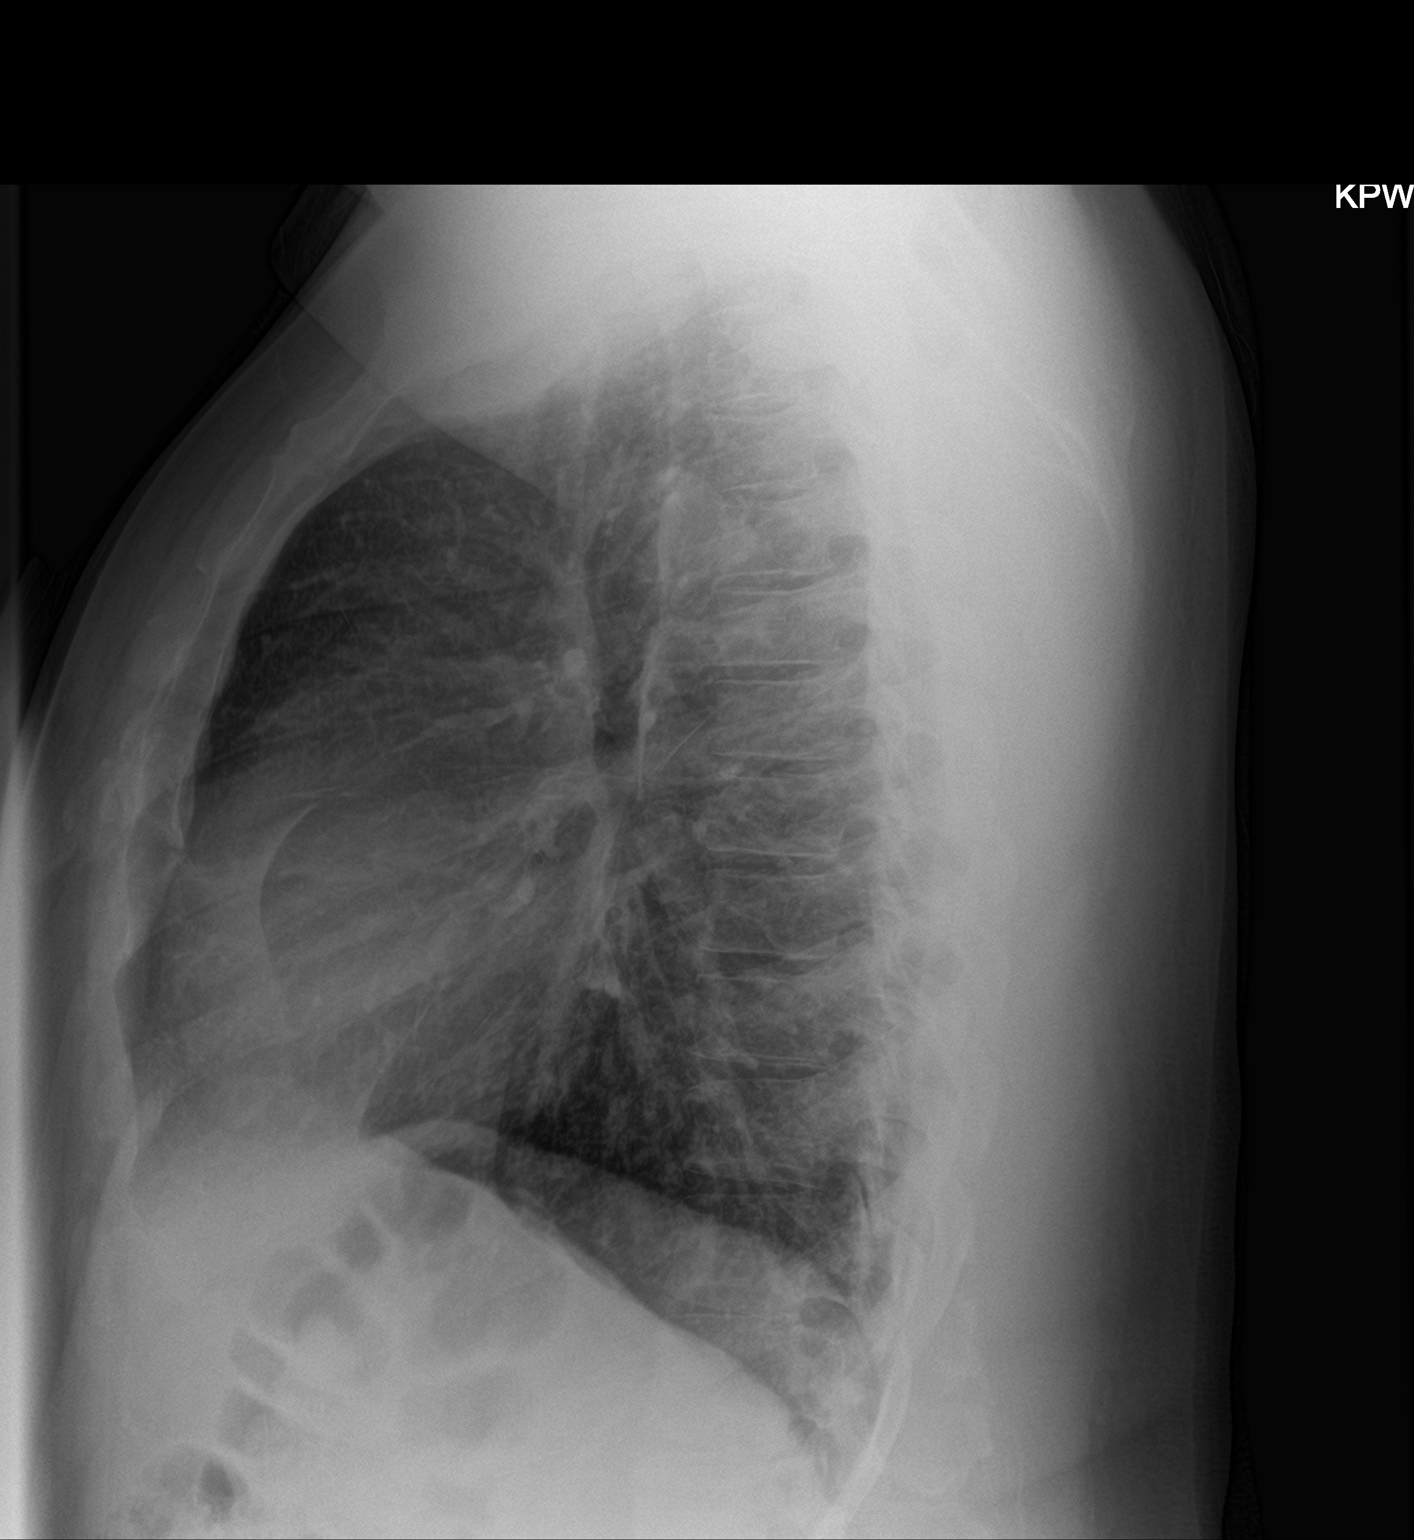

[2 of 2 positions shown; findings below may reference images not displayed]

FINDINGS: The heart size and mediastinal contours are within normal limits.
Mildly increased bilateral interstitial markings. Unchanged area of
scarring or atelectasis at the periphery of the left lung base. No
pleural effusion or pneumothorax. The visualized skeletal structures
are unremarkable.
IMPRESSION: Mildly increased bilateral interstitial markings, which may reflect
bronchitic-type lung changes.

## 2022-09-01 ENCOUNTER — Encounter: Payer: Self-pay | Admitting: Dermatology

## 2022-09-01 ENCOUNTER — Ambulatory Visit: Payer: 59 | Admitting: Dermatology

## 2022-09-01 VITALS — BP 135/83 | HR 69

## 2022-09-01 DIAGNOSIS — L2081 Atopic neurodermatitis: Secondary | ICD-10-CM

## 2022-09-01 DIAGNOSIS — L750 Bromhidrosis: Secondary | ICD-10-CM | POA: Diagnosis not present

## 2022-09-01 DIAGNOSIS — L819 Disorder of pigmentation, unspecified: Secondary | ICD-10-CM

## 2022-09-01 DIAGNOSIS — Z79899 Other long term (current) drug therapy: Secondary | ICD-10-CM | POA: Diagnosis not present

## 2022-09-01 DIAGNOSIS — L299 Pruritus, unspecified: Secondary | ICD-10-CM | POA: Diagnosis not present

## 2022-09-01 DIAGNOSIS — L281 Prurigo nodularis: Secondary | ICD-10-CM | POA: Diagnosis not present

## 2022-09-01 MED ORDER — TACROLIMUS 0.1 % EX OINT: TOPICAL_OINTMENT | CUTANEOUS | 2 refills | Status: DC

## 2022-09-01 MED ORDER — DUPIXENT 300 MG/2ML ~~LOC~~ SOSY: 600.0000 mg | PREFILLED_SYRINGE | Freq: Once | SUBCUTANEOUS | 0 refills | Status: AC

## 2022-09-01 MED ORDER — MUPIROCIN 2 % EX OINT: TOPICAL_OINTMENT | CUTANEOUS | 2 refills | Status: DC

## 2022-09-01 NOTE — Patient Instructions (Addendum)
Start Mupirocin ointment twice daily to open sores  Start Tacrolimus 0.1% ointment twice daily  to affected areas.   Dupilumab (Dupixent) is a treatment given by injection for adults and children with moderate-to-severe atopic dermatitis. Goal is control of skin condition, not cure. It is given as 2 injections at the first dose followed by 1 injection ever 2 weeks thereafter.  Young children are dosed monthly.  Potential side effects include allergic reaction, herpes infections, injection site reactions and conjunctivitis (inflammation of the eyes).  The use of Dupixent requires long term medication management, including periodic office visits.     Gentle Skin Care Guide  1. Bathe no more than once a day.  2. Avoid bathing in hot water  3. Use a mild soap like Dove, Vanicream, Cetaphil, CeraVe. Can use Lever 2000 or Cetaphil antibacterial soap  4. Use soap only where you need it. On most days, use it under your arms, between your legs, and on your feet. Let the water rinse other areas unless visibly dirty.  5. When you get out of the bath/shower, use a towel to gently blot your skin dry, don't rub it.  6. While your skin is still a little damp, apply a moisturizing cream such as Vanicream, CeraVe, Cetaphil, Eucerin, Sarna lotion or plain Vaseline Jelly. For hands apply Neutrogena [place] Hand Cream or Excipial Hand Cream.  7. Reapply moisturizer any time you start to itch or feel dry.  8. Sometimes using free and clear laundry detergents can be helpful. Fabric softener sheets should be avoided. Downy Free & Gentle liquid, or any liquid fabric softener that is free of dyes and perfumes, it acceptable to use  9. If your doctor has given you prescription creams you may apply moisturizers over them     Due to recent changes in healthcare laws, you may see results of your pathology and/or laboratory studies on MyChart before the doctors have had a chance to review them. We understand  that in some cases there may be results that are confusing or concerning to you. Please understand that not all results are received at the same time and often the doctors may need to interpret multiple results in order to provide you with the best plan of care or course of treatment. Therefore, we ask that you please give [place] 2 business days to thoroughly review all your results before contacting the office for clarification. Should we see a critical lab result, you will be contacted sooner.   If You Need Anything After Your Visit  If you have any questions or concerns for your doctor, please call our main line at [phone] and press option 4 to reach your doctor's medical assistant. If no one answers, please leave a voicemail as directed and we will return your call as soon as possible. Messages left after 4 pm will be answered the following business day.   You may also send [place] a message via MyChart. We typically respond to MyChart messages within 1-2 business days.  For prescription refills, please ask your pharmacy to contact our office. Our fax number is [phone].  If you have an urgent issue when the clinic is closed that cannot wait until the next business day, you can page your doctor at the number below.    Please note that while we do our best to be available for urgent issues outside of office hours, we are not available 24/7.   If you have an urgent issue and are unable to  reach [place], you may choose to seek medical care at your doctor's office, retail clinic, urgent care center, or emergency room.  If you have a medical emergency, please immediately call 911 or go to the emergency department.  Pager Numbers  - Dr. Nehemiah Massed: [phone]  - Dr. Laurence Ferrari: [phone]  - Dr. Nicole Kindred: [phone]  In the event of inclement weather, please call our main line at [phone] for an update on the status of any delays or closures.  Dermatology Medication Tips: Please keep the  boxes that topical medications come in in order to help keep track of the instructions about where and how to use these. Pharmacies typically print the medication instructions only on the boxes and not directly on the medication tubes.   If your medication is too expensive, please contact our office at [phone] option 4 or send [place] a message through [place].   We are unable to tell what your co-pay for medications will be in advance as this is different depending on your insurance coverage. However, we may be able to find a substitute medication at lower cost or fill out paperwork to get insurance to cover a needed medication.   If a prior authorization is required to get your medication covered by your insurance company, please allow [place] 1-2 business days to complete this process.  Drug prices often vary depending on where the prescription is filled and some pharmacies may offer cheaper prices.  The website www.goodrx.com contains coupons for medications through different pharmacies. The prices here do not account for what the cost may be with help from insurance (it may be cheaper with your insurance), but the website can give you the price if you did not use any insurance.  - You can print the associated coupon and take it with your prescription to the pharmacy.  - You may also stop by our office during regular business hours and pick up a GoodRx coupon card.  - If you need your prescription sent electronically to a different pharmacy, notify our office through [facility] or by phone at [phone] option 4.     Si Usted Necesita Algo Despus de Su Visita  Tambin puede enviarnos un mensaje a travs de Pharmacist, community. Por lo general respondemos a los mensajes de MyChart en el transcurso de 1 a 2 das hbiles.  Para renovar recetas, por favor pida a su farmacia que se ponga en contacto con nuestra oficina. Harland Dingwall de fax es [place] [phone].  Si tiene un asunto urgente cuando la  clnica est cerrada y que no puede esperar hasta el siguiente da hbil, puede llamar/localizar a su doctor(a) al nmero que aparece a continuacin.   Por favor, tenga en cuenta que aunque hacemos todo lo posible para estar disponibles para asuntos urgentes fuera del horario de [place], no estamos disponibles las 24 horas del da, los 7 das de la [place].   Si tiene un problema urgente y no puede comunicarse con nosotros, puede optar por buscar atencin mdica  en el consultorio de su doctor(a), en una clnica privada, en un centro de atencin urgente o en una sala de emergencias.  Si tiene Engineering geologist, por favor llame inmediatamente al 911 o vaya a la sala de emergencias.  Nmeros de bper  - Dr. Nehemiah Massed: [phone]  - Dra. Moye: [phone]  - Dra. Nicole Kindred: [phone]  En caso de inclemencias del [place], por favor llame a Johnsie Kindred principal al [phone] para una actualizacin sobre el [place] de cualquier  retraso o cierre.  Consejos para la medicacin en dermatologa: Por favor, guarde las cajas en las que vienen los medicamentos de uso tpico para ayudarle a seguir las instrucciones sobre dnde y cmo usarlos. Las farmacias generalmente imprimen las instrucciones del medicamento slo en las cajas y no directamente en los tubos del [place].   Si su medicamento es muy caro, por favor, pngase en contacto con Rolm [place] llamando al [phone] y presione la opcin 4 o envenos un mensaje a travs de Clinical cytogeneticist.   No podemos decirle cul ser su copago por los medicamentos por adelantado ya que esto es diferente dependiendo de la cobertura de su seguro. Sin embargo, es posible que podamos encontrar un medicamento sustituto a Audiological scientist un formulario para que el seguro cubra el medicamento que se considera necesario.   Si se requiere una autorizacin previa para que su compaa de seguros [place] su medicamento, por favor permtanos de 1 a 2 das hbiles  para completar [address]  Los precios de los medicamentos varan con frecuencia dependiendo del Environmental consultant de dnde se surte la receta y alguna farmacias pueden ofrecer precios ms baratos.  El sitio web www.goodrx.com tiene cupones para medicamentos de Health and safety inspector. Los precios aqu no tienen en cuenta lo que podra costar con la ayuda del seguro (puede ser ms barato con su seguro), pero el sitio web puede darle el precio si no utiliz Tourist information centre manager.  - Puede imprimir el cupn correspondiente y llevarlo con su receta a la farmacia.  - Tambin puede pasar por nuestra oficina durante el horario de atencin regular y Education officer, museum una tarjeta de cupones de GoodRx.  - Si necesita que su receta se enve electrnicamente a una farmacia diferente, informe a nuestra oficina a travs de MyChart de [place] o por telfono llamando al [phone] y presione la opcin 4.

## 2022-09-01 NOTE — Progress Notes (Unsigned)
   New Patient Visit  Subjective  Derrick Hopkins is a 52 y.o. male who presents for the following: Rash (Started after working outside. Started with burning on face. Was scaly now has firm sores that are scratched open. Has been using Crisco shortening, helps some. Used Triamcinolone 0.1% cream, helped on face, did not help on body areas. Itching. Worse with temperature changes).  Review of Systems: No other skin or systemic complaints except as noted in HPI or Assessment and Plan.  Objective  Well appearing patient in no apparent distress; mood and affect are within normal limits.  A focused examination was performed including face, torso, arms, legs. Relevant physical exam findings are noted in the Assessment and Plan.  face, torso, arms, legs Scattered erythematous, hyperpigmented nodules, scattered excoriations, scattered areas of discoloration, with odor.  Marked xerosis.                    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 Condition is bothersome/symptomatic for patient. Currently flared. Pt Has failed topical corticosteroids and moisturizer.   Loading dose of Dupixent given today  B/L upper arms. 600mg  total dose Lot: [id number] Exp: 10/2024 NDC: 0947-0962-83 Return every 2 weeks for injections. If helping will send in prescription.   Start Mupirocin ointment twice daily to open sores Start Tacrolimus 0.1% ointment twice daily  to affected areas of rash; itch.   Dupilumab (Dupixent) is a treatment given by injection for adults and children with moderate-to-severe atopic dermatitis. Goal is control of skin condition, not cure. It is given as 2 injections at the first dose followed by 1 injection ever 2 weeks thereafter.   Young children are dosed monthly.  Potential side effects include allergic reaction, herpes infections, injection site reactions and conjunctivitis (inflammation of the eyes).  The use of Dupixent requires long term medication management, including periodic office visits.  mupirocin ointment (BACTROBAN) 2 % - face, torso, arms, legs Apply twice daily to open sores tacrolimus (PROTOPIC) 0.1 % ointment - face, torso, arms, legs Apply twice daily to affected areas on arms, legs, and torso dupilumab (DUPIXENT) 300 MG/2ML prefilled syringe - face, torso, arms, legs Inject 600 mg into the skin once for 1 dose. On day 1.  Return in about 2 weeks (around 09/15/2022) for Atopic Dermatitis Follow Up, Dupixent injection.  I, Emelia Salisbury, CMA, am acting as scribe for Sarina Ser, MD. Documentation: I have reviewed the above documentation for accuracy and completeness, and I agree with the above.  Sarina Ser, MD

## 2022-09-02 ENCOUNTER — Encounter: Payer: Self-pay | Admitting: Dermatology

## 2022-09-15 ENCOUNTER — Ambulatory Visit: Payer: 59 | Admitting: Dermatology

## 2022-09-15 DIAGNOSIS — L299 Pruritus, unspecified: Secondary | ICD-10-CM | POA: Diagnosis not present

## 2022-09-15 DIAGNOSIS — Z79899 Other long term (current) drug therapy: Secondary | ICD-10-CM

## 2022-09-15 DIAGNOSIS — L2081 Atopic neurodermatitis: Secondary | ICD-10-CM

## 2022-09-15 DIAGNOSIS — L281 Prurigo nodularis: Secondary | ICD-10-CM

## 2022-09-15 MED ORDER — MUPIROCIN 2 % EX OINT: TOPICAL_OINTMENT | CUTANEOUS | 2 refills | Status: DC

## 2022-09-15 MED ORDER — TACROLIMUS 0.1 % EX OINT: TOPICAL_OINTMENT | CUTANEOUS | 2 refills | Status: DC

## 2022-09-15 MED ORDER — DUPIXENT 300 MG/2ML ~~LOC~~ SOSY: 300.0000 mg | PREFILLED_SYRINGE | SUBCUTANEOUS | 0 refills | Status: DC

## 2022-09-15 MED ORDER — DUPIXENT 300 MG/2ML ~~LOC~~ SOAJ: 300.0000 mg | SUBCUTANEOUS | 3 refills | Status: DC

## 2022-09-15 NOTE — Progress Notes (Signed)
   Follow-Up Visit   Subjective  Derrick Hopkins is a 52 y.o. male who presents for the following: Follow-up (Atopic neurodermatitis 2 week follow up - itching may be a little less since starting Dupixent but he has used a 60 g tube of tacrolimus in just 2 weeks).  The following portions of the chart were reviewed this encounter and updated as appropriate:   Tobacco  Allergies  Meds  Problems  Med Hx  Surg Hx  Fam Hx     Review of Systems:  No other skin or systemic complaints except as noted in HPI or Assessment and Plan.  Objective  Well appearing patient in no apparent distress; mood and affect are within normal limits.  A focused examination was performed including trunk, extremities. Relevant physical exam findings are noted in the Assessment and Plan.   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 Condition is bothersome/symptomatic for patient. Currently flared. Pt Has failed topical corticosteroids and moisturizer.    Dupixent given today  left upper arm.  Lot: [id number] Exp: 10/2024 NDC: 5750-5183-35 Return every 2 weeks for injections.  Start Mupirocin ointment twice daily to open sores Start Tacrolimus 0.1% ointment twice daily  to affected areas of rash; itch.   Dupilumab (Dupixent) is a treatment given by injection for adults and children with moderate-to-severe atopic dermatitis. Goal is control of skin condition, not cure. It is given as 2 injections at the first dose followed by 1 injection ever 2 weeks thereafter.  Young children are dosed monthly.   Potential side effects include allergic reaction, herpes infections, injection site reactions and conjunctivitis (inflammation of the eyes).  The use of  Dupixent requires long term medication management, including periodic office visits.  tacrolimus (PROTOPIC) 0.1 % ointment Apply twice daily to affected areas on arms, legs, and torso  mupirocin ointment (BACTROBAN) 2 % Apply twice daily to open sores  Dupilumab (DUPIXENT) 300 MG/2ML SOPN Inject 300 mg into the skin every 14 (fourteen) days. Starting at day 15 for maintenance.  dupilumab (DUPIXENT) 300 MG/2ML prefilled syringe Inject 300 mg into the skin every 14 (fourteen) days. Starting at day 15 for maintenance.  Return in about 2 weeks (around 09/29/2022) for [place].  I, Ashok Cordia, CMA, am acting as scribe for Sarina Ser, MD . Documentation: I have reviewed the above documentation for accuracy and completeness, and I agree with the above.  Sarina Ser, MD

## 2022-09-15 NOTE — Patient Instructions (Signed)
Due to recent changes in healthcare laws, you may see results of your pathology and/or laboratory studies on MyChart before the doctors have had a chance to review them. We understand that in some cases there may be results that are confusing or concerning to you. Please understand that not all results are received at the same time and often the doctors may need to interpret multiple results in order to provide you with the best plan of care or course of treatment. Therefore, we ask that you please give us 2 business days to thoroughly review all your results before contacting the office for clarification. Should we see a critical lab result, you will be contacted sooner.   If You Need Anything After Your Visit  If you have any questions or concerns for your doctor, please call our main line at [phone] and press option 4 to reach your doctor's medical assistant. If no one answers, please leave a voicemail as directed and we will return your call as soon as possible. Messages left after 4 pm will be answered the following business day.   You may also send us a message via MyChart. We typically respond to MyChart messages within 1-2 business days.  For prescription refills, please ask your pharmacy to contact our office. Our fax number is [phone].  If you have an urgent issue when the clinic is closed that cannot wait until the next business day, you can page your doctor at the number below.    Please note that while we do our best to be available for urgent issues outside of office hours, we are not available 24/7.   If you have an urgent issue and are unable to reach us, you may choose to seek medical care at your doctor's office, retail clinic, urgent care center, or emergency room.  If you have a medical emergency, please immediately call 911 or go to the emergency department.  Pager Numbers  - Dr. Kowalski: [phone]  - Dr. Moye: [phone]  - Dr. Stewart:  [phone]  In the event of inclement weather, please call our main line at [phone] for an update on the status of any delays or closures.  Dermatology Medication Tips: Please keep the boxes that topical medications come in in order to help keep track of the instructions about where and how to use these. Pharmacies typically print the medication instructions only on the boxes and not directly on the medication tubes.   If your medication is too expensive, please contact our office at [phone] option 4 or send us a message through MyChart.   We are unable to tell what your co-pay for medications will be in advance as this is different depending on your insurance coverage. However, we may be able to find a substitute medication at lower cost or fill out paperwork to get insurance to cover a needed medication.   If a prior authorization is required to get your medication covered by your insurance company, please allow us 1-2 business days to complete this process.  Drug prices often vary depending on where the prescription is filled and some pharmacies may offer cheaper prices.  The website www.goodrx.com contains coupons for medications through different pharmacies. The prices here do not account for what the cost may be with help from insurance (it may be cheaper with your insurance), but the website can give you the price if you did not use any insurance.  - You can print the associated coupon and take it with   your prescription to the pharmacy.  - You may also stop by our office during regular business hours and pick up a GoodRx coupon card.  - If you need your prescription sent electronically to a different pharmacy, notify our office through [place] MyChart or by phone at [phone] option 4.     Si Usted Necesita Algo Despus de Su Visita  Tambin puede enviarnos un mensaje a travs de MyChart. Por lo general respondemos a los mensajes de MyChart en el transcurso de 1 a 2  das hbiles.  Para renovar recetas, por favor pida a su farmacia que se ponga en contacto con nuestra oficina. Nuestro nmero de fax es el [phone].  Si tiene un asunto urgente cuando la clnica est cerrada y que no puede esperar hasta el siguiente da hbil, puede llamar/localizar a su doctor(a) al nmero que aparece a continuacin.   Por favor, tenga en cuenta que aunque hacemos todo lo posible para estar disponibles para asuntos urgentes fuera del horario de oficina, no estamos disponibles las 24 horas del da, los 7 das de la semana.   Si tiene un problema urgente y no puede comunicarse con nosotros, puede optar por buscar atencin mdica  en el consultorio de su doctor(a), en una clnica privada, en un centro de atencin urgente o en una sala de emergencias.  Si tiene una emergencia mdica, por favor llame inmediatamente al 911 o vaya a la sala de emergencias.  Nmeros de bper  - Dr. Kowalski: [phone]  - Dra. Moye: [phone]  - Dra. Stewart: [phone]  En caso de inclemencias del tiempo, por favor llame a nuestra lnea principal al [phone] para una actualizacin sobre el [place] de cualquier retraso o cierre.  Consejos para la medicacin en dermatologa: Por favor, guarde las cajas en las que vienen los medicamentos de uso tpico para ayudarle a seguir las instrucciones sobre dnde y cmo usarlos. Las farmacias generalmente imprimen las instrucciones del medicamento slo en las cajas y no directamente en los tubos del medicamento.   Si su medicamento es muy caro, por favor, pngase en contacto con nuestra oficina llamando al [phone] y presione la opcin 4 o envenos un mensaje a travs de MyChart.   No podemos decirle cul ser su copago por los medicamentos por adelantado ya que esto es diferente dependiendo de la cobertura de su seguro. Sin embargo, es posible que podamos encontrar un medicamento sustituto a menor costo o llenar un formulario para que el  seguro cubra el medicamento que se considera necesario.   Si se requiere una autorizacin previa para que su compaa de seguros cubra su medicamento, por favor permtanos de 1 a 2 das hbiles para completar este proceso.  Los precios de los medicamentos varan con frecuencia dependiendo del lugar de dnde se surte la receta y alguna farmacias pueden ofrecer precios ms baratos.  El sitio web www.goodrx.com tiene cupones para medicamentos de diferentes farmacias. Los precios aqu no tienen en cuenta lo que podra costar con la ayuda del seguro (puede ser ms barato con su seguro), pero el sitio web puede darle el precio si no utiliz ningn seguro.  - Puede imprimir el cupn correspondiente y llevarlo con su receta a la farmacia.  - Tambin puede pasar por nuestra oficina durante el horario de atencin regular y recoger una tarjeta de cupones de GoodRx.  - Si necesita que su receta se enve electrnicamente a una farmacia diferente, informe a nuestra oficina a travs de MyChart de    o por telfono llamando al [phone] y presione la opcin 4.  

## 2022-09-24 ENCOUNTER — Encounter: Payer: Self-pay | Admitting: Dermatology

## 2022-09-29 ENCOUNTER — Ambulatory Visit: Payer: Self-pay | Admitting: Dermatology

## 2022-09-29 VITALS — BP 154/90 | HR 56

## 2022-09-29 DIAGNOSIS — L281 Prurigo nodularis: Secondary | ICD-10-CM

## 2022-09-29 DIAGNOSIS — L2081 Atopic neurodermatitis: Secondary | ICD-10-CM

## 2022-09-29 DIAGNOSIS — Z79899 Other long term (current) drug therapy: Secondary | ICD-10-CM

## 2022-09-29 DIAGNOSIS — L819 Disorder of pigmentation, unspecified: Secondary | ICD-10-CM

## 2022-09-29 MED ORDER — DUPIXENT 300 MG/2ML ~~LOC~~ SOAJ: 300.0000 mg | SUBCUTANEOUS | 3 refills | Status: DC

## 2022-09-29 MED ORDER — DUPIXENT 300 MG/2ML ~~LOC~~ SOSY: 300.0000 mg | PREFILLED_SYRINGE | SUBCUTANEOUS | 0 refills | Status: DC

## 2022-09-29 NOTE — Patient Instructions (Addendum)
Continue Dupixent 300 mg injections every 2 weeks as directed.   Dupilumab (Dupixent) is a treatment given by injection for adults and children with moderate-to-severe atopic dermatitis. Goal is control of skin condition, not cure. It is given as 2 injections at the first dose followed by 1 injection ever 2 weeks thereafter.  Young children are dosed monthly.  Potential side effects include allergic reaction, herpes infections, injection site reactions and conjunctivitis (inflammation of the eyes).  The use of Dupixent requires long term medication management, including periodic office visits.    Continue Mupirocin ointment twice daily to open sores Continue Tacrolimus 0.1% ointment twice daily  to affected areas of rash; itch.    Gentle Skin Care Guide  1. Bathe no more than once a day.  2. Avoid bathing in hot water  3. Use a mild soap like Dove, Vanicream, Cetaphil, CeraVe. Can use Lever 2000 or Cetaphil antibacterial soap  4. Use soap only where you need it. On most days, use it under your arms, between your legs, and on your feet. Let the water rinse other areas unless visibly dirty.  5. When you get out of the bath/shower, use a towel to gently blot your skin dry, don't rub it.  6. While your skin is still a little damp, apply a moisturizing cream such as Vanicream, CeraVe, Cetaphil, Eucerin, Sarna lotion or plain Vaseline Jelly. For hands apply Neutrogena [place] Hand Cream or Excipial Hand Cream.  7. Reapply moisturizer any time you start to itch or feel dry.  8. Sometimes using free and clear laundry detergents can be helpful. Fabric softener sheets should be avoided. Downy Free & Gentle liquid, or any liquid fabric softener that is free of dyes and perfumes, it acceptable to use  9. If your doctor has given you prescription creams you may apply moisturizers over them     Due to recent changes in healthcare laws, you may see results of your pathology and/or laboratory  studies on MyChart before the doctors have had a chance to review them. We understand that in some cases there may be results that are confusing or concerning to you. Please understand that not all results are received at the same time and often the doctors may need to interpret multiple results in order to provide you with the best plan of care or course of treatment. Therefore, we ask that you please give [place] 2 business days to thoroughly review all your results before contacting the office for clarification. Should we see a critical lab result, you will be contacted sooner.   If You Need Anything After Your Visit  If you have any questions or concerns for your doctor, please call our main line at [phone] and press option 4 to reach your doctor's medical assistant. If no one answers, please leave a voicemail as directed and we will return your call as soon as possible. Messages left after 4 pm will be answered the following business day.   You may also send [place] a message via [place]. We typically respond to MyChart messages within 1-2 business days.  For prescription refills, please ask your pharmacy to contact our office. Our fax number is [phone].  If you have an urgent issue when the clinic is closed that cannot wait until the next business day, you can page your doctor at the number below.    Please note that while we do our best to be available for urgent issues outside of office hours, we are not  available 24/7.   If you have an urgent issue and are unable to reach [place], you may choose to seek medical care at your doctor's office, retail clinic, urgent care center, or emergency room.  If you have a medical emergency, please immediately call 911 or go to the emergency department.  Pager Numbers  - Dr. Nehemiah Massed: [phone]  - Dr. Laurence Ferrari: [phone]  - Dr. Nicole Kindred: [phone]  In the event of inclement weather, please call our main line at [phone] for an update on the  status of any delays or closures.  Dermatology Medication Tips: Please keep the boxes that topical medications come in in order to help keep track of the instructions about where and how to use these. Pharmacies typically print the medication instructions only on the boxes and not directly on the medication tubes.   If your medication is too expensive, please contact our office at [phone] option 4 or send [place] a message through [place].   We are unable to tell what your co-pay for medications will be in advance as this is different depending on your insurance coverage. However, we may be able to find a substitute medication at lower cost or fill out paperwork to get insurance to cover a needed medication.   If a prior authorization is required to get your medication covered by your insurance company, please allow [place] 1-2 business days to complete this process.  Drug prices often vary depending on where the prescription is filled and some pharmacies may offer cheaper prices.  The website www.goodrx.com contains coupons for medications through different pharmacies. The prices here do not account for what the cost may be with help from insurance (it may be cheaper with your insurance), but the website can give you the price if you did not use any insurance.  - You can print the associated coupon and take it with your prescription to the pharmacy.  - You may also stop by our office during regular business hours and pick up a GoodRx coupon card.  - If you need your prescription sent electronically to a different pharmacy, notify our office through [facility] or by phone at [phone] option 4.     Si Usted Necesita Algo Despus de Su Visita  Tambin puede enviarnos un mensaje a travs de Pharmacist, community. Por lo general respondemos a los mensajes de MyChart en el transcurso de 1 a 2 das hbiles.  Para renovar recetas, por favor pida a su farmacia que se ponga en contacto con nuestra oficina.  Harland Dingwall de fax es [place] [phone].  Si tiene un asunto urgente cuando la clnica est cerrada y que no puede esperar hasta el siguiente da hbil, puede llamar/localizar a su doctor(a) al nmero que aparece a continuacin.   Por favor, tenga en cuenta que aunque hacemos todo lo posible para estar disponibles para asuntos urgentes fuera del horario de [place], no estamos disponibles las 24 horas del da, los 7 das de la [place].   Si tiene un problema urgente y no puede comunicarse con nosotros, puede optar por buscar atencin mdica  en el consultorio de su doctor(a), en una clnica privada, en un centro de atencin urgente o en una sala de emergencias.  Si tiene Engineering geologist, por favor llame inmediatamente al 911 o vaya a la sala de emergencias.  Nmeros de bper  - Dr. Nehemiah Massed: [phone]  - Dra. Moye: [phone]  - Dra. Nicole Kindred: [phone]  En caso de inclemencias del tiempo, por favor llame  a Johnsie Kindred principal al [phone] para una actualizacin sobre el [place] de cualquier retraso o cierre.  Consejos para la medicacin en dermatologa: Por favor, guarde las cajas en las que vienen los medicamentos de uso tpico para ayudarle a seguir las instrucciones sobre dnde y cmo usarlos. Las farmacias generalmente imprimen las instrucciones del medicamento slo en las cajas y no directamente en los tubos del [place].   Si su medicamento es muy caro, por favor, pngase en contacto con Zigmund Daniel llamando al [phone] y presione la opcin 4 o envenos un mensaje a travs de Pharmacist, community.   No podemos decirle cul ser su copago por los medicamentos por adelantado ya que esto es diferente dependiendo de la cobertura de su seguro. Sin embargo, es posible que podamos encontrar un medicamento sustituto a Electrical engineer un formulario para que el seguro cubra el medicamento que se considera necesario.   Si se requiere una autorizacin previa para que su  compaa de seguros [place] su medicamento, por favor permtanos de 1 a 2 das hbiles para completar este proceso.  Los precios de los medicamentos varan con frecuencia dependiendo del Environmental consultant de dnde se surte la receta y alguna farmacias pueden ofrecer precios ms baratos.  El sitio web www.goodrx.com tiene cupones para medicamentos de Airline pilot. Los precios aqu no tienen en cuenta lo que podra costar con la ayuda del seguro (puede ser ms barato con su seguro), pero el sitio web puede darle el precio si no utiliz Research scientist (physical sciences).  - Puede imprimir el cupn correspondiente y llevarlo con su receta a la farmacia.  - Tambin puede pasar por nuestra oficina durante el horario de atencin regular y Charity fundraiser una tarjeta de cupones de GoodRx.  - Si necesita que su receta se enve electrnicamente a una farmacia diferente, informe a nuestra oficina a travs de MyChart de [place] o por telfono llamando al [phone] y presione la opcin 4.

## 2022-09-29 NOTE — Progress Notes (Unsigned)
   Follow-Up Visit   Subjective  Derrick Hopkins is a 52 y.o. male who presents for the following: atopic neurodermatitis (2 week recheck. Here for Dupixent injection. States Dupixent is helping. Taking a long time for open sores to heal. Itching comes and goes, not as bad as before. Started Dupixent 09/01/2022. Concerned with dark areas left behind. Has received call from [place], will call her back today).  The following portions of the chart were reviewed this encounter and updated as appropriate:  Tobacco  Allergies  Meds  Problems  Med Hx  Surg Hx  Fam Hx     Review of Systems: No other skin or systemic complaints except as noted in HPI or Assessment and Plan.  Objective  Well appearing patient in no apparent distress; mood and affect are within normal limits.  A focused examination was performed including face, neck, arms, torso. Relevant physical exam findings are noted in the Assessment and Plan.   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   With prurigo nodularis and Pruritus and Dyschromia/Hyperpigmentation and Lichenification  BSA 80%   Chronic and persistent condition with duration or expected duration over one year. Condition is bothersome/symptomatic for patient. Currently flared. Pt Has failed topical corticosteroids and moisturizer.    Dupixent given today  right upper arm.  Lot: [id number] Exp: 11/2024 NDC: [id number] Return every 2 weeks for injections.   Continue Mupirocin ointment twice daily to open sores Continue Tacrolimus 0.1% ointment twice daily  to affected areas of rash; itch.   Dupilumab (Dupixent) is a treatment given by injection for adults and children with moderate-to-severe atopic dermatitis. Goal is control of skin condition, not cure. It is given as 2 injections at the first dose followed by 1 injection ever 2 weeks thereafter.   Young children are dosed monthly.   Potential side effects include allergic reaction, herpes infections, injection site reactions and conjunctivitis (inflammation of the eyes).  The use of Dupixent requires long term medication management, including periodic office visits.  Related Medications tacrolimus (PROTOPIC) 0.1 % ointment Apply twice daily to affected areas on arms, legs, and torso  mupirocin ointment (BACTROBAN) 2 % Apply twice daily to open sores  dupilumab (DUPIXENT) 300 MG/2ML prefilled syringe Inject 300 mg into the skin every 14 (fourteen) days. Starting at day 15 for maintenance.  dupilumab (DUPIXENT) 300 MG/2ML prefilled syringe Inject 300 mg into the skin every 14 (fourteen) days. Starting at day 15 for maintenance.  Dupilumab (DUPIXENT) 300 MG/2ML SOPN Inject 300 mg into the skin every 14 (fourteen) days. Starting at day 15 for maintenance.  Return for Atopic neurodermatitis follow up and Dupixent injection in 2 weeks.  I, Emelia Salisbury, CMA, am acting as scribe for Sarina Ser, MD. Documentation: I have reviewed the above documentation for accuracy and completeness, and I agree with the above.  Sarina Ser, MD

## 2022-09-30 ENCOUNTER — Encounter: Payer: Self-pay | Admitting: Dermatology

## 2022-10-08 ENCOUNTER — Other Ambulatory Visit (HOSPITAL_COMMUNITY): Payer: Self-pay

## 2022-10-13 ENCOUNTER — Ambulatory Visit: Payer: 59 | Admitting: Dermatology

## 2023-03-26 ENCOUNTER — Emergency Department
Admission: EM | Admit: 2023-03-26 | Discharge: 2023-03-26 | Disposition: A | Discharge status: Home / Self Care | Payer: Medicaid Other | Attending: Emergency Medicine | Admitting: Emergency Medicine

## 2023-03-26 ENCOUNTER — Other Ambulatory Visit: Payer: Self-pay

## 2023-03-26 DIAGNOSIS — F17200 Nicotine dependence, unspecified, uncomplicated: Secondary | ICD-10-CM | POA: Insufficient documentation

## 2023-03-26 DIAGNOSIS — L0390 Cellulitis, unspecified: Secondary | ICD-10-CM | POA: Diagnosis not present

## 2023-03-26 DIAGNOSIS — I10 Essential (primary) hypertension: Secondary | ICD-10-CM | POA: Insufficient documentation

## 2023-03-26 DIAGNOSIS — E119 Type 2 diabetes mellitus without complications: Secondary | ICD-10-CM | POA: Insufficient documentation

## 2023-03-26 DIAGNOSIS — L739 Follicular disorder, unspecified: Secondary | ICD-10-CM

## 2023-03-26 LAB — BASIC METABOLIC PANEL
Anion gap: 8 (ref 5–15)
BUN: 12 mg/dL (ref 6–20)
CO2: 24 mmol/L (ref 22–32)
Calcium: 8.8 mg/dL — ABNORMAL LOW (ref 8.9–10.3)
Chloride: 103 mmol/L (ref 98–111)
Creatinine, Ser: 0.97 mg/dL (ref 0.61–1.24)
GFR, Estimated: >60 mL/min (ref >60)
Glucose, Bld: 176 mg/dL — ABNORMAL HIGH (ref 70–99)
Potassium: 3.9 mmol/L (ref 3.5–5.1)
Sodium: 135 mmol/L (ref 135–145)

## 2023-03-26 LAB — CBC
HCT: 42.5 % (ref 39.0–52.0)
Hemoglobin: 14.7 g/dL (ref 13.0–17.0)
MCH: 34.8 pg — ABNORMAL HIGH (ref 26.0–34.0)
MCHC: 34.6 g/dL (ref 30.0–36.0)
MCV: 100.5 fL — ABNORMAL HIGH (ref 80.0–100.0)
Platelets: 238 10*3/uL (ref 150–400)
RBC: 4.23 MIL/uL (ref 4.22–5.81)
RDW: 12.4 % (ref 11.5–15.5)
WBC: 8 10*3/uL (ref 4.0–10.5)
nRBC: 0 % (ref 0.0–0.2)

## 2023-03-26 MED ORDER — CHLORHEXIDINE GLUCONATE 4 % EX SOLN: Freq: Once | CUTANEOUS | Status: AC

## 2023-03-26 MED ORDER — CEPHALEXIN 500 MG PO CAPS: 500.0000 mg | ORAL_CAPSULE | Freq: Three times a day (TID) | ORAL | 0 refills | Status: AC

## 2023-03-26 MED ORDER — ERYTHROMYCIN 5 MG/GM OP OINT
TOPICAL_OINTMENT | Freq: Four times a day (QID) | OPHTHALMIC | Status: DC
  Administered 2023-03-26: 1 via OPHTHALMIC
  Filled 2023-03-26: qty 1

## 2023-03-26 MED ORDER — CEPHALEXIN 500 MG PO CAPS
500.0000 mg | ORAL_CAPSULE | Freq: Once | ORAL | Status: AC
  Administered 2023-03-26: 500 mg via ORAL
  Filled 2023-03-26: qty 1

## 2023-03-26 NOTE — ED Triage Notes (Signed)
Pt presents to the ED POV from home. Pt states that he was in [facility] jail for 14 days. States that after a week of being there he started to get wounds. Pt states that his eyes swelled up and turned black around them. Reports drainage from his eyes. Pt reports that the jail was very dirty and that he also saw two big spiders while in the jail. Pt states that he has had some chills and fatigue. Pt states that he has a wound in his right inguinal area. This RN notes a wound to pt's right cheek.

## 2023-03-26 NOTE — ED Provider Notes (Signed)
[facility] Emergency Department Provider Note     Event Date/Time   First MD Initiated Contact with Patient 03/26/23 1507     (approximate)   History   Wound Check and Chills   HPI  Derrick Hopkins is a 52 y.o. male with a history of tobacco use disorder, HTN, arthritis, type 2 diabetes, presents to the ED from home.  Patient reports 14-day stent in the [facility] jail, and for the time he was there he noted dirty conditions.  He noted an exacerbation of his eczema describing irritation and dryness of the periorbital skin with peeling and cracking.  He also noted multiple pustules and papules primarily to his beard area since time he was in.  He does admit to manipulating these areas and have any significant suppression of purulent drainage.  He presents to the ED noting tender firm areas remaining to where the pustules were.  Patient denies any fevers, chills, or sweats.  Denies any prior history of MRSA infection.  Physical Exam   Triage Vital Signs: ED Triage Vitals [03/26/23 1158]  Encounter Vitals Group     BP (!) 143/94     Systolic BP Percentile      Diastolic BP Percentile      Pulse Rate 76     Resp 18     Temp 98.6 F (37 C)     Temp Source Oral     SpO2 98 %     Weight 235 lb (106.6 kg)     Height 6' (1.829 m)     Head Circumference      Peak Flow      Pain Score 6     Pain Loc      Pain Education      Exclude from Growth Chart     Most recent vital signs: Vitals:   03/26/23 1158  BP: (!) 143/94  Pulse: 76  Resp: 18  Temp: 98.6 F (37 C)  SpO2: 98%    General Awake, no distress. NAD HEENT NCAT. PERRL. EOMI. No rhinorrhea. Mucous membranes are moist. CV:  Good peripheral perfusion.  RESP:  Normal effort.  ABD:  No distention.  SKIN:  Patient with firm papular areas of cellulitis with central scabbing areas where he previously noted pustules with purulent drainage.  No lymphangitis or streaking is  appreciated.   ED Results / Procedures / Treatments   Labs (all labs ordered are listed, but only abnormal results are displayed) Labs Reviewed  CBC - Abnormal; Notable for the following components:      Result Value   MCV 100.5 (*)    MCH 34.8 (*)    All other components within normal limits  BASIC METABOLIC PANEL - Abnormal; Notable for the following components:   Glucose, Bld 176 (*)    Calcium 8.8 (*)    All other components within normal limits     EKG   RADIOLOGY  No results found.   PROCEDURES:  Critical Care performed: No  Procedures   MEDICATIONS ORDERED IN ED: Medications - No data to display   IMPRESSION / MDM / ASSESSMENT AND PLAN / ED COURSE  I reviewed the triage vital signs and the nursing notes.                              Differential diagnosis includes, but is not limited to, MRSA, cellulitis, folliculitis, contact dermatitis, eczema  exacerbation  Patient's presentation is most consistent with acute complicated illness / injury requiring diagnostic workup.  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 Patient will be discharged home with prescriptions for ***. Patient is to follow up with *** as needed or otherwise directed. Patient is given ED precautions to return to the ED for any worsening or new symptoms.     FINAL CLINICAL IMPRESSION(S) / ED DIAGNOSES   Final diagnoses:  None     Rx / DC Orders   ED Discharge Orders     None        Note:  This document was prepared using Dragon voice recognition software and may include unintentional dictation errors.

## 2023-03-26 NOTE — Discharge Instructions (Signed)
You are being treated for cellulitis and folliculitis. Take the antibiotic as directed. Use the surgical soap 2-3 times a week. Apply the ointment to the eyelids as demonstrated. Follow-up with your primary provider as needed.

## 2023-03-27 ENCOUNTER — Telehealth: Payer: Self-pay | Admitting: Physician Assistant

## 2023-03-27 MED ORDER — DOXYCYCLINE HYCLATE 100 MG PO TABS: 100.0000 mg | ORAL_TABLET | Freq: Two times a day (BID) | ORAL | 0 refills | Status: DC

## 2023-03-27 NOTE — Telephone Encounter (Cosign Needed)
Patient being sent a RX for doxycycline to cover for MRSA.

## 2023-06-04 ENCOUNTER — Emergency Department
Admission: EM | Admit: 2023-06-04 | Discharge: 2023-06-05 | Disposition: A | Discharge status: Home / Self Care | Payer: Medicaid Other | Attending: Emergency Medicine | Admitting: Emergency Medicine

## 2023-06-04 ENCOUNTER — Emergency Department: Payer: Medicaid Other

## 2023-06-04 ENCOUNTER — Encounter: Payer: Self-pay | Admitting: *Deleted

## 2023-06-04 ENCOUNTER — Other Ambulatory Visit: Payer: Self-pay

## 2023-06-04 DIAGNOSIS — R222 Localized swelling, mass and lump, trunk: Secondary | ICD-10-CM

## 2023-06-04 DIAGNOSIS — E119 Type 2 diabetes mellitus without complications: Secondary | ICD-10-CM | POA: Insufficient documentation

## 2023-06-04 DIAGNOSIS — R0789 Other chest pain: Secondary | ICD-10-CM | POA: Diagnosis not present

## 2023-06-04 DIAGNOSIS — F17200 Nicotine dependence, unspecified, uncomplicated: Secondary | ICD-10-CM | POA: Diagnosis not present

## 2023-06-04 DIAGNOSIS — L0291 Cutaneous abscess, unspecified: Secondary | ICD-10-CM

## 2023-06-04 DIAGNOSIS — L02212 Cutaneous abscess of back [any part, except buttock]: Secondary | ICD-10-CM | POA: Insufficient documentation

## 2023-06-04 LAB — CBC WITH DIFFERENTIAL/PLATELET
Abs Immature Granulocytes: 0.04 10*3/uL (ref 0.00–0.07)
Basophils Absolute: 0 10*3/uL (ref 0.0–0.1)
Basophils Relative: 0 %
Eosinophils Absolute: 0.1 10*3/uL (ref 0.0–0.5)
Eosinophils Relative: 1 %
HCT: 42.5 % (ref 39.0–52.0)
Hemoglobin: 14.5 g/dL (ref 13.0–17.0)
Immature Granulocytes: 0 %
Lymphocytes Relative: 12 %
Lymphs Abs: 1.4 10*3/uL (ref 0.7–4.0)
MCH: 34.9 pg — ABNORMAL HIGH (ref 26.0–34.0)
MCHC: 34.1 g/dL (ref 30.0–36.0)
MCV: 102.4 fL — ABNORMAL HIGH (ref 80.0–100.0)
Monocytes Absolute: 0.9 10*3/uL (ref 0.1–1.0)
Monocytes Relative: 8 %
Neutro Abs: 8.8 10*3/uL — ABNORMAL HIGH (ref 1.7–7.7)
Neutrophils Relative %: 79 %
Platelets: 236 10*3/uL (ref 150–400)
RBC: 4.15 MIL/uL — ABNORMAL LOW (ref 4.22–5.81)
RDW: 13 % (ref 11.5–15.5)
WBC: 11.2 10*3/uL — ABNORMAL HIGH (ref 4.0–10.5)
nRBC: 0 % (ref 0.0–0.2)

## 2023-06-04 LAB — BASIC METABOLIC PANEL
Anion gap: 9 (ref 5–15)
BUN: 11 mg/dL (ref 6–20)
CO2: 25 mmol/L (ref 22–32)
Calcium: 8.9 mg/dL (ref 8.9–10.3)
Chloride: 101 mmol/L (ref 98–111)
Creatinine, Ser: 0.91 mg/dL (ref 0.61–1.24)
GFR, Estimated: >60 mL/min (ref >60)
Glucose, Bld: 100 mg/dL — ABNORMAL HIGH (ref 70–99)
Potassium: 4.6 mmol/L (ref 3.5–5.1)
Sodium: 135 mmol/L (ref 135–145)

## 2023-06-04 MED ORDER — IOHEXOL 300 MG/ML  SOLN
75.0000 mL | Freq: Once | INTRAMUSCULAR | Status: AC | PRN
  Administered 2023-06-04: 75 mL via INTRAVENOUS

## 2023-06-04 MED ORDER — IBUPROFEN 800 MG PO TABS
800.0000 mg | ORAL_TABLET | Freq: Once | ORAL | Status: AC
  Administered 2023-06-04: 800 mg via ORAL
  Filled 2023-06-04: qty 1

## 2023-06-04 NOTE — ED Notes (Signed)
Pt states he had an abscess on R flank that he "popped" open and "green and yellow pus came out with some blood. Pt also has a large bump on his spine that he believes is also an abscess. The skin is intact there and lump is every hard.

## 2023-06-04 NOTE — ED Provider Notes (Signed)
[facility] Provider Note    Event Date/Time   First MD Initiated Contact with Patient 06/04/23 2013     (approximate)   History   Abscess   HPI  Derrick Hopkins is a 52 y.o. male with PMH of substance-induced mood disorder, diabetes, arthritis, tobacco use, alcohol use disorder and eczema presents for evaluation of an abscess on his back and right side.  Patient states that the spot on his side swelled up, turned red and he poked it and opened it, he said he pulled yellow pus out of it.  The spot on his back began to swell up over the last few days.  He states that it is painful and it has not had any drainage.      Physical Exam   Triage Vital Signs: ED Triage Vitals  Encounter Vitals Group     BP 06/04/23 1916 (!) 146/79     Systolic BP Percentile --      Diastolic BP Percentile --      Pulse Rate 06/04/23 1916 75     Resp 06/04/23 1916 18     Temp 06/04/23 1916 100.1 F (37.8 C)     Temp Source 06/04/23 1916 Oral     SpO2 06/04/23 1916 98 %     Weight 06/04/23 1915 233 lb 11 oz (106 kg)     Height 06/04/23 1915 6' (1.829 m)     Head Circumference --      Peak Flow --      Pain Score 06/04/23 1915 8     Pain Loc --      Pain Education --      Exclude from Growth Chart --     Most recent vital signs: Vitals:   06/04/23 2221 06/04/23 2255  BP: (!) 150/82   Pulse: 81   Resp: 17   Temp: (!) 100.6 F (38.1 C) 99 F (37.2 C)  SpO2: 94%    General: Awake, no distress.  CV:  Good peripheral perfusion.  Resp:  Normal effort.  Abd:  No distention.  Other:  Open wound approximately 1 cm in diameter with surrounding erythema on patient's right chest wall, no drainage.  Approximately 5 cm in diameter mass in the middle of patient's back between his shoulder blades, hard, tender to palpation, round and well-circumscribed, no overlying skin changes or erythema to the mass.   ED Results / Procedures / Treatments   Labs (all labs ordered  are listed, but only abnormal results are displayed) Labs Reviewed  BASIC METABOLIC PANEL - Abnormal; Notable for the following components:      Result Value   Glucose, Bld 100 (*)    All other components within normal limits  CBC WITH DIFFERENTIAL/PLATELET - Abnormal; Notable for the following components:   WBC 11.2 (*)    RBC 4.15 (*)    MCV 102.4 (*)    MCH 34.9 (*)    Neutro Abs 8.8 (*)    All other components within normal limits     RADIOLOGY  CT obtained to evaluate the mass, interpreted the images as well as reviewed the radiologist report.   PROCEDURES:  Critical Care performed: No  Procedures   MEDICATIONS ORDERED IN ED: Medications  ibuprofen (ADVIL) tablet 800 mg (800 mg Oral Given 06/04/23 2211)  iohexol (OMNIPAQUE) 300 MG/ML solution 75 mL (75 mLs Intravenous Contrast Given 06/04/23 2303)     IMPRESSION / MDM / ASSESSMENT AND PLAN / ED  COURSE  I reviewed the triage vital signs and the nursing notes.                             52 year old male presents for evaluation of an abscess on his abdomen and upper back.  Patient was hypertensive in triage and developed fever when he was back in the room, patient's fever has since come down.  Vital signs stable otherwise.  Patient NAD on exam.  Differential diagnosis includes, but is not limited to, abscess, cyst, lipoma, malignancy.  Patient's presentation is most consistent with acute complicated illness / injury requiring diagnostic workup.  Patient has a draining abscess on the right abdomen with some surrounding erythema, I am concerned about pending infection so I will send short course of oral antibiotics for this.  Both myself and my attending physician used the bedside ultrasound to evaluate the mass on patient's back and did not see any collection of fluid.  CT chest obtained to further evaluate the mass on patient's back.  Results pending. BMP unremarkable and CBC notable for mildly elevated white  count.  Patient was given ibuprofen for pain and fever while in the ED.  Care of the patient will be passed off to Dr. Modesto Charon who will determine the final diagnosis and plan.  If CT images are negative patient can be discharged with an antibiotic and ibuprofen and may follow-up with his primary care provider or dermatology.      FINAL CLINICAL IMPRESSION(S) / ED DIAGNOSES   Final diagnoses:  Mass on back     Rx / DC Orders   ED Discharge Orders          Ordered    ibuprofen (ADVIL) 800 MG tablet  Every 8 hours PRN        06/05/23 0025    cephALEXin (KEFLEX) 500 MG capsule  4 times daily        06/05/23 0025             Note:  This document was prepared using Dragon voice recognition software and may include unintentional dictation errors.   Cameron Ali, PA-C 06/05/23 0026    Pilar Jarvis, MD 06/05/23 [id number]

## 2023-06-04 NOTE — ED Triage Notes (Signed)
Pt has abscess to right upper abd and upper back area.   Sx for 1 week.   Pt alert.

## 2023-06-04 NOTE — ED Notes (Signed)
ED Provider at bedside. 

## 2023-06-04 NOTE — ED Provider Notes (Incomplete)
[facility] Provider Note    Event Date/Time   First MD Initiated Contact with Patient 06/04/23 2013     (approximate)   History   Abscess   HPI  Skye A Wolters is a 52 y.o. male with PMH of substance-induced mood disorder, diabetes, arthritis, tobacco use, alcohol use disorder and eczema presents for evaluation of an abscess on his back and right side.  Patient states that the spot on his side swelled up, turned red and he poked it and opened it, he said he pulled yellow pus out of it.  The spot on his back began to swell up over the last few days.  He states that it is painful and it has not had any drainage.      Physical Exam   Triage Vital Signs: ED Triage Vitals  Encounter Vitals Group     BP 06/04/23 1916 (!) 146/79     Systolic BP Percentile --      Diastolic BP Percentile --      Pulse Rate 06/04/23 1916 75     Resp 06/04/23 1916 18     Temp 06/04/23 1916 100.1 F (37.8 C)     Temp Source 06/04/23 1916 Oral     SpO2 06/04/23 1916 98 %     Weight 06/04/23 1915 233 lb 11 oz (106 kg)     Height 06/04/23 1915 6' (1.829 m)     Head Circumference --      Peak Flow --      Pain Score 06/04/23 1915 8     Pain Loc --      Pain Education --      Exclude from Growth Chart --     Most recent vital signs: Vitals:   06/04/23 1916  BP: (!) 146/79  Pulse: 75  Resp: 18  Temp: 100.1 F (37.8 C)  SpO2: 98%   General: Awake, no distress.  CV:  Good peripheral perfusion.  Resp:  Normal effort.  Abd:  No distention.  Other:  Open wound approximately 1 cm in diameter with surrounding erythema on patient's right chest wall, no drainage.  Approximately 5 cm in diameter mass in the middle of patient's back between his shoulder blades, hard, tender to palpation, round and well-circumscribed, no overlying skin changes or erythema to the mass.   ED Results / Procedures / Treatments   Labs (all labs ordered are listed, but only abnormal results  are displayed) Labs Reviewed - No data to display   RADIOLOGY  CT obtained to evaluate the mass, interpreted the images as well as reviewed the radiologist report.   PROCEDURES:  Critical Care performed: No  Procedures   MEDICATIONS ORDERED IN ED: Medications - No data to display   IMPRESSION / MDM / ASSESSMENT AND PLAN / ED COURSE  I reviewed the triage vital signs and the nursing notes.                             52 year old male presents for evaluation of an abscess on his abdomen and upper back.  Patient was hypertensive in triage and developed fever when he was back in the room, patient's fever has since come down.  Vital signs stable otherwise.  Patient NAD on exam.  Differential diagnosis includes, but is not limited to, abscess, cyst, lipoma, malignancy.  Patient's presentation is most consistent with acute complicated illness / injury requiring diagnostic workup.  Both myself and my attending physician use the bedside ultrasound to evaluate the mass on patient's back and did not see any collection of fluid.  CT chest obtained to further evaluate the mass on patient's back.  I interpreted the images as well as reviewed the radiologist report.  BMP unremarkable and CBC notable for mildly elevated white count.  Patient was given ibuprofen for pain and fever while in the ED.      FINAL CLINICAL IMPRESSION(S) / ED DIAGNOSES   Final diagnoses:  None     Rx / DC Orders   ED Discharge Orders     None        Note:  This document was prepared using Dragon voice recognition software and may include unintentional dictation errors.

## 2023-06-05 MED ORDER — SULFAMETHOXAZOLE-TRIMETHOPRIM 800-160 MG PO TABS
1.0000 | ORAL_TABLET | Freq: Once | ORAL | Status: AC
  Administered 2023-06-05: 1 via ORAL
  Filled 2023-06-05: qty 1

## 2023-06-05 MED ORDER — LIDOCAINE-EPINEPHRINE 1 %-1:100000 IJ SOLN
10.0000 mL | Freq: Once | INTRAMUSCULAR | Status: AC
  Administered 2023-06-05: 10 mL
  Filled 2023-06-05: qty 1

## 2023-06-05 MED ORDER — CEPHALEXIN 500 MG PO CAPS: 500.0000 mg | ORAL_CAPSULE | Freq: Four times a day (QID) | ORAL | 0 refills | Status: DC

## 2023-06-05 MED ORDER — IBUPROFEN 800 MG PO TABS: 800.0000 mg | ORAL_TABLET | Freq: Three times a day (TID) | ORAL | 0 refills | Status: DC | PRN

## 2023-06-05 MED ORDER — SULFAMETHOXAZOLE-TRIMETHOPRIM 800-160 MG PO TABS: 1.0000 | ORAL_TABLET | Freq: Two times a day (BID) | ORAL | 0 refills | Status: AC

## 2023-06-05 NOTE — ED Notes (Signed)
 Provided pt with discharge instructions and education. All of pt questions answered. Pt in possession of all belongings. Pt AAOX4 and stable at time of discharge.Pt ambulated w/ steady gait towards ED exit.

## 2023-06-05 NOTE — ED Notes (Signed)
ED Provider at bedside. 

## 2023-06-05 NOTE — ED Provider Notes (Signed)
..  Incision and Drainage  Date/Time: 06/05/2023 1:39 AM  Performed by: Pilar Jarvis, MD Authorized by: Pilar Jarvis, MD   Consent:    Consent obtained:  Verbal   Risks discussed:  Bleeding, incomplete drainage, infection and damage to other organs   Alternatives discussed:  No treatment Universal protocol:    Procedure explained and questions answered to patient or proxy's satisfaction: yes     Patient identity confirmed:  Verbally with patient Location:    Type:  Abscess   Location:  Trunk   Trunk location:  Back Pre-procedure details:    Skin preparation:  Chlorhexidine with alcohol Sedation:    Sedation type:  None Anesthesia:    Anesthesia method:  Local infiltration   Local anesthetic:  Lidocaine 1% WITH epi Procedure type:    Complexity:  Complex Procedure details:    Incision types:  Stab incision   Wound management:  Probed and deloculated   Drainage:  Bloody and serous   Drainage amount:  Scant   Wound treatment:  Wound left open   Packing materials:  None Post-procedure details:    Procedure completion:  Damian Leavell, MD 06/05/23 0139

## 2023-06-05 NOTE — Discharge Instructions (Addendum)
Please take the antibiotic as prescribed.  You can take the ibuprofen every 8 hours as needed for pain.  Please keep your wound clean by washing at least daily with soap and water. If you see any signs of infection like spreading redness, pus coming from the wound, extreme pain, fevers, chills or any other worsening doctor right away or come back to the emergency department   Thank you for choosing [place] for your health care today!  Please see your primary doctor this week for a follow up appointment.   If you have any new, worsening, or unexpected symptoms call your doctor right away or come back to the emergency department for reevaluation.  It was my pleasure to care for you today.   Daneil Dan Modesto Charon, MD

## 2023-07-14 ENCOUNTER — Ambulatory Visit: Payer: Medicaid Other | Admitting: Dermatology

## 2023-07-14 DIAGNOSIS — S80812A Abrasion, left lower leg, initial encounter: Secondary | ICD-10-CM

## 2023-07-14 DIAGNOSIS — L819 Disorder of pigmentation, unspecified: Secondary | ICD-10-CM | POA: Diagnosis not present

## 2023-07-14 DIAGNOSIS — Z79899 Other long term (current) drug therapy: Secondary | ICD-10-CM

## 2023-07-14 DIAGNOSIS — S80811A Abrasion, right lower leg, initial encounter: Secondary | ICD-10-CM

## 2023-07-14 DIAGNOSIS — Z7189 Other specified counseling: Secondary | ICD-10-CM

## 2023-07-14 DIAGNOSIS — S40812A Abrasion of left upper arm, initial encounter: Secondary | ICD-10-CM

## 2023-07-14 DIAGNOSIS — T07XXXA Unspecified multiple injuries, initial encounter: Secondary | ICD-10-CM

## 2023-07-14 DIAGNOSIS — L2081 Atopic neurodermatitis: Secondary | ICD-10-CM

## 2023-07-14 DIAGNOSIS — L281 Prurigo nodularis: Secondary | ICD-10-CM

## 2023-07-14 DIAGNOSIS — S40811A Abrasion of right upper arm, initial encounter: Secondary | ICD-10-CM

## 2023-07-14 DIAGNOSIS — T148XXA Other injury of unspecified body region, initial encounter: Secondary | ICD-10-CM

## 2023-07-14 DIAGNOSIS — Z9889 Other specified postprocedural states: Secondary | ICD-10-CM

## 2023-07-14 DIAGNOSIS — Z872 Personal history of diseases of the skin and subcutaneous tissue: Secondary | ICD-10-CM

## 2023-07-14 MED ORDER — DUPILUMAB 300 MG/2ML ~~LOC~~ SOSY
600.0000 mg | PREFILLED_SYRINGE | Freq: Once | SUBCUTANEOUS | Status: AC
Start: 2023-07-14 — End: 2023-07-14
  Administered 2023-07-14: 600 mg via SUBCUTANEOUS

## 2023-07-14 NOTE — Progress Notes (Signed)
   Follow-Up Visit   Subjective  Derrick Hopkins is a 52 y.o. male who presents for the following: patient here today for follow up on atopic dermatitis at trunk and extremities. Currently flared. Has been off dupixent injections for 8 - 9 months due to insurance changes.  Patient would like to restart medication.  The patient has spots, moles and lesions to be evaluated, some may be new or changing and the patient may have concern these could be cancer.  The following portions of the chart were reviewed this encounter and updated as appropriate: medications, allergies, medical history  Review of Systems:  No other skin or systemic complaints except as noted in HPI or Assessment and Plan.  Objective  Well appearing patient in no apparent distress; mood and affect are within normal limits.   A focused examination was performed of the following areas: Trunk, arms, legs  Relevant exam findings are noted in the Assessment and Plan.             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 Condition is bothersome/symptomatic for patient. Currently flared. Pt Has failed topical corticosteroids and moisturizer.    Re-Start Dupixent. Pt has done well in the past.  Initial dose was injected today of Dupixent 300 mg / 2ml  syringe into right arm and Dupixent 300 mg/67ml syringe injected into left arm. Patient tolerated well with no adverse reactions   Lot: [id number] Exp: 03/2025 NDC: 5621-3086-57  Return in 2 weeks for follow up Dupixent rx sent to [facility]. Patient prefers pen treatment and was self injecting medication at home. Will submit paperwork to [facility] for insurance approval   Dupilumab (Dupixent) is a treatment given by injection for adults and children with moderate-to-severe atopic  dermatitis. Goal is control of skin condition, not cure. It is given as 2 injections at the first dose followed by 1 injection ever 2 weeks thereafter.  Young children are dosed monthly.   Potential side effects include allergic reaction, herpes infections, injection site reactions and conjunctivitis (inflammation of the eyes).  The use of Dupixent requires long term medication management, including periodic office visits.  History of Abscess/Cyst at Left upper back Exam: healing wound  Treatment Plan: Benign. Observe History of abscess - patient underwent surgery to fix.  Recommend to continue wound care as instructed by surgeon  Wound cleaned, applied mupirocin 2 % ointment and covered with bandage.   Atopic neurodermatitis  Related Medications tacrolimus (PROTOPIC) 0.1 % ointment Apply twice daily to affected areas on arms, legs, and torso  mupirocin ointment (BACTROBAN) 2 % Apply twice daily to open sores  dupilumab (DUPIXENT) prefilled syringe 600 mg   Dupilumab (DUPIXENT) 300 MG/2ML SOAJ Inject 300 mg into the skin every 14 (fourteen) days. Starting at day 15 for maintenance.    Return for 2 week dupixent follow up, 6 month atopic derm follow up.  IAsher Muir, CMA, am acting as scribe for Armida Sans, MD.   Documentation: I have reviewed the above documentation for accuracy and completeness, and I agree with the above.  Armida Sans, MD

## 2023-07-14 NOTE — Patient Instructions (Addendum)
Dupilumab (Dupixent) is a treatment given by injection for adults and children with moderate-to-severe atopic dermatitis. Goal is control of skin condition, not cure. It is given as 2 injections at the first dose followed by 1 injection ever 2 weeks thereafter.  Young children are dosed monthly.  Potential side effects include allergic reaction, herpes infections, injection site reactions and conjunctivitis (inflammation of the eyes).  The use of Dupixent requires long term medication management, including periodic office visits.  Due to recent changes in healthcare laws, you may see results of your pathology and/or laboratory studies on MyChart before the doctors have had a chance to review them. We understand that in some cases there may be results that are confusing or concerning to you. Please understand that not all results are received at the same time and often the doctors may need to interpret multiple results in order to provide you with the best plan of care or course of treatment. Therefore, we ask that you please give [place] 2 business days to thoroughly review all your results before contacting the office for clarification. Should we see a critical lab result, you will be contacted sooner.   If You Need Anything After Your Visit  If you have any questions or concerns for your doctor, please call our main line at [phone] and press option 4 to reach your doctor's medical assistant. If no one answers, please leave a voicemail as directed and we will return your call as soon as possible. Messages left after 4 pm will be answered the following business day.   You may also send [place] a message via MyChart. We typically respond to MyChart messages within 1-2 business days.  For prescription refills, please ask your pharmacy to contact our office. Our fax number is [phone].  If you have an urgent issue when the clinic is closed that cannot wait until the next business day, you can page your  doctor at the number below.    Please note that while we do our best to be available for urgent issues outside of office hours, we are not available 24/7.   If you have an urgent issue and are unable to reach [place], you may choose to seek medical care at your doctor's office, retail clinic, urgent care center, or emergency room.  If you have a medical emergency, please immediately call 911 or go to the emergency department.  Pager Numbers  - Dr. Gwen Pounds: [phone]  - Dr. Roseanne Reno: [phone]  - Dr. Katrinka Blazing: [phone]   In the event of inclement weather, please call our main line at [phone] for an update on the status of any delays or closures.  Dermatology Medication Tips: Please keep the boxes that topical medications come in in order to help keep track of the instructions about where and how to use these. Pharmacies typically print the medication instructions only on the boxes and not directly on the medication tubes.   If your medication is too expensive, please contact our office at [phone] option 4 or send [place] a message through MyChart.   We are unable to tell what your co-pay for medications will be in advance as this is different depending on your insurance coverage. However, we may be able to find a substitute medication at lower cost or fill out paperwork to get insurance to cover a needed medication.   If a prior authorization is required to get your medication covered by your insurance company, please allow [place] 1-2 business days to complete  this process.  Drug prices often vary depending on where the prescription is filled and some pharmacies may offer cheaper prices.  The website www.goodrx.com contains coupons for medications through different pharmacies. The prices here do not account for what the cost may be with help from insurance (it may be cheaper with your insurance), but the website can give you the price if you did not use any insurance.  - You can print  the associated coupon and take it with your prescription to the pharmacy.  - You may also stop by our office during regular business hours and pick up a GoodRx coupon card.  - If you need your prescription sent electronically to a different pharmacy, notify our office through [facility] or by phone at [phone] option 4.     Si Usted Necesita Algo Despus de Su Visita  Tambin puede enviarnos un mensaje a travs de Clinical cytogeneticist. Por lo general respondemos a los mensajes de MyChart en el transcurso de 1 a 2 das hbiles.  Para renovar recetas, por favor pida a su farmacia que se ponga en contacto con nuestra oficina. Annie Sable de fax es [place] [phone].  Si tiene un asunto urgente cuando la clnica est cerrada y que no puede esperar hasta el siguiente da hbil, puede llamar/localizar a su doctor(a) al nmero que aparece a continuacin.   Por favor, tenga en cuenta que aunque hacemos todo lo posible para estar disponibles para asuntos urgentes fuera del horario de [place], no estamos disponibles las 24 horas del da, los 7 [address]  [address] de la [place].   Si tiene un problema urgente y no puede comunicarse con nosotros, puede optar por buscar atencin mdica  en el consultorio de su doctor(a), en una clnica privada, en un centro de atencin urgente o en una sala de emergencias.  Si tiene Engineer, drilling, por favor llame inmediatamente al 911 o vaya a la sala de emergencias.  Nmeros de bper  - Dr. Gwen Pounds: [phone]  - Dra. Roseanne Reno: [phone]  - Dr. Katrinka Blazing: [phone]   En caso de inclemencias del tiempo, por favor llame a Lacy Duverney principal al [phone] para una actualizacin sobre el [place] de cualquier retraso o cierre.  Consejos para la medicacin en dermatologa: Por favor, guarde las cajas en las que vienen los medicamentos de uso tpico para ayudarle a seguir las instrucciones sobre dnde y cmo usarlos. Las farmacias generalmente imprimen las  instrucciones del medicamento slo en las cajas y no directamente en los tubos del [place].   Si su medicamento es muy caro, por favor, pngase en contacto con Rolm Gala llamando al [phone] y presione la opcin 4 o envenos un mensaje a travs de Clinical cytogeneticist.   No podemos decirle cul ser su copago por los medicamentos por adelantado ya que esto es diferente dependiendo de la cobertura de su seguro. Sin embargo, es posible que podamos encontrar un medicamento sustituto a Audiological scientist un formulario para que el seguro cubra el medicamento que se considera necesario.   Si se requiere una autorizacin previa para que su compaa de seguros [place] su medicamento, por favor permtanos de 1 a 2 das hbiles para completar [address]  Los precios de los medicamentos varan con frecuencia dependiendo del Environmental consultant de dnde se surte la receta y alguna farmacias pueden ofrecer precios ms baratos.  El sitio web www.goodrx.com tiene cupones para medicamentos de Health and safety inspector. Los precios aqu no tienen en cuenta lo que podra costar con la ayuda del  seguro (puede ser ms barato con su seguro), pero el sitio web puede darle el precio si no Visual merchandiser.  - Puede imprimir el cupn correspondiente y llevarlo con su receta a la farmacia.  - Tambin puede pasar por nuestra oficina durante el horario de atencin regular y Education officer, museum una tarjeta de cupones de GoodRx.  - Si necesita que su receta se enve electrnicamente a una farmacia diferente, informe a nuestra oficina a travs de MyChart de [place] o por telfono llamando al [phone] y presione la opcin 4.

## 2023-07-15 MED ORDER — DUPIXENT 300 MG/2ML ~~LOC~~ SOAJ: 300.0000 mg | SUBCUTANEOUS | 5 refills | Status: DC

## 2023-07-19 ENCOUNTER — Encounter: Payer: Self-pay | Admitting: Dermatology

## 2023-07-28 ENCOUNTER — Encounter: Payer: Self-pay | Admitting: Dermatology

## 2023-07-28 ENCOUNTER — Ambulatory Visit: Payer: Medicaid Other | Admitting: Dermatology

## 2023-07-28 DIAGNOSIS — Z79899 Other long term (current) drug therapy: Secondary | ICD-10-CM

## 2023-07-28 DIAGNOSIS — L819 Disorder of pigmentation, unspecified: Secondary | ICD-10-CM | POA: Diagnosis not present

## 2023-07-28 DIAGNOSIS — L299 Pruritus, unspecified: Secondary | ICD-10-CM

## 2023-07-28 DIAGNOSIS — L281 Prurigo nodularis: Secondary | ICD-10-CM

## 2023-07-28 DIAGNOSIS — L2081 Atopic neurodermatitis: Secondary | ICD-10-CM

## 2023-07-28 DIAGNOSIS — Z7189 Other specified counseling: Secondary | ICD-10-CM

## 2023-07-28 MED ORDER — TRIAMCINOLONE ACETONIDE 0.1 % EX CREA: 1.0000 | TOPICAL_CREAM | CUTANEOUS | 2 refills | Status: AC

## 2023-07-28 MED ORDER — TACROLIMUS 0.1 % EX OINT: TOPICAL_OINTMENT | CUTANEOUS | 6 refills | Status: AC

## 2023-07-28 MED ORDER — DUPILUMAB 300 MG/2ML ~~LOC~~ SOSY
300.0000 mg | PREFILLED_SYRINGE | Freq: Once | SUBCUTANEOUS | Status: AC
Start: 2023-07-28 — End: 2023-07-28
  Administered 2023-07-28: 300 mg via SUBCUTANEOUS

## 2023-07-28 MED ORDER — MUPIROCIN 2 % EX OINT: 1.0000 | TOPICAL_OINTMENT | Freq: Every day | CUTANEOUS | 6 refills | Status: AC

## 2023-07-28 NOTE — Patient Instructions (Addendum)
Continue Dupixent 300mg /30ml  injections every 2 weeks Continue Mupirocin ointment daily to open sores Continue Tacrolimus 0.1% ointment 1 to 2 times a day to eczema on face, groin, and underarms as needed for flares Restart TMC 0.1% cream once daily up to 5 days a week affected area  back, abdomen, arms, legs as needed for eczema flares, avoid face, groin, axilla    Due to recent changes in healthcare laws, you may see results of your pathology and/or laboratory studies on MyChart before the doctors have had a chance to review them. We understand that in some cases there may be results that are confusing or concerning to you. Please understand that not all results are received at the same time and often the doctors may need to interpret multiple results in order to provide you with the best plan of care or course of treatment. Therefore, we ask that you please give [place] 2 business days to thoroughly review all your results before contacting the office for clarification. Should we see a critical lab result, you will be contacted sooner.   If You Need Anything After Your Visit  If you have any questions or concerns for your doctor, please call our main line at [phone] and press option 4 to reach your doctor's medical assistant. If no one answers, please leave a voicemail as directed and we will return your call as soon as possible. Messages left after 4 pm will be answered the following business day.   You may also send [place] a message via MyChart. We typically respond to MyChart messages within 1-2 business days.  For prescription refills, please ask your pharmacy to contact our office. Our fax number is [phone].  If you have an urgent issue when the clinic is closed that cannot wait until the next business day, you can page your doctor at the number below.    Please note that while we do our best to be available for urgent issues outside of office hours, we are not available 24/7.   If you  have an urgent issue and are unable to reach [place], you may choose to seek medical care at your doctor's office, retail clinic, urgent care center, or emergency room.  If you have a medical emergency, please immediately call 911 or go to the emergency department.  Pager Numbers  - Dr. Gwen Pounds: [phone]  - Dr. Roseanne Reno: [phone]  - Dr. Katrinka Blazing: [phone]   In the event of inclement weather, please call our main line at [phone] for an update on the status of any delays or closures.  Dermatology Medication Tips: Please keep the boxes that topical medications come in in order to help keep track of the instructions about where and how to use these. Pharmacies typically print the medication instructions only on the boxes and not directly on the medication tubes.   If your medication is too expensive, please contact our office at [phone] option 4 or send [place] a message through MyChart.   We are unable to tell what your co-pay for medications will be in advance as this is different depending on your insurance coverage. However, we may be able to find a substitute medication at lower cost or fill out paperwork to get insurance to cover a needed medication.   If a prior authorization is required to get your medication covered by your insurance company, please allow [place] 1-2 business days to complete this process.  Drug prices often vary depending on where the prescription is filled  and some pharmacies may offer cheaper prices.  The website www.goodrx.com contains coupons for medications through different pharmacies. The prices here do not account for what the cost may be with help from insurance (it may be cheaper with your insurance), but the website can give you the price if you did not use any insurance.  - You can print the associated coupon and take it with your prescription to the pharmacy.  - You may also stop by our office during regular business hours and pick up a GoodRx coupon  card.  - If you need your prescription sent electronically to a different pharmacy, notify our office through [facility] or by phone at [phone] option 4.     Si Usted Necesita Algo Despus de Su Visita  Tambin puede enviarnos un mensaje a travs de Clinical cytogeneticist. Por lo general respondemos a los mensajes de MyChart en el transcurso de 1 a 2 das hbiles.  Para renovar recetas, por favor pida a su farmacia que se ponga en contacto con nuestra oficina. Annie Sable de fax es [place] [phone].  Si tiene un asunto urgente cuando la clnica est cerrada y que no puede esperar hasta el siguiente da hbil, puede llamar/localizar a su doctor(a) al nmero que aparece a continuacin.   Por favor, tenga en cuenta que aunque hacemos todo lo posible para estar disponibles para asuntos urgentes fuera del horario de [place], no estamos disponibles las 24 horas del da, los 7 [address]  [address] de la [place].   Si tiene un problema urgente y no puede comunicarse con nosotros, puede optar por buscar atencin mdica  en el consultorio de su doctor(a), en una clnica privada, en un centro de atencin urgente o en una sala de emergencias.  Si tiene Engineer, drilling, por favor llame inmediatamente al 911 o vaya a la sala de emergencias.  Nmeros de bper  - Dr. Gwen Pounds: [phone]  - Dra. Roseanne Reno: [phone]  - Dr. Katrinka Blazing: [phone]   En caso de inclemencias del tiempo, por favor llame a Lacy Duverney principal al [phone] para una actualizacin sobre el [place] de cualquier retraso o cierre.  Consejos para la medicacin en dermatologa: Por favor, guarde las cajas en las que vienen los medicamentos de uso tpico para ayudarle a seguir las instrucciones sobre dnde y cmo usarlos. Las farmacias generalmente imprimen las instrucciones del medicamento slo en las cajas y no directamente en los tubos del [place].   Si su medicamento es muy caro, por favor, pngase en contacto con Rolm Gala llamando al [phone] y presione la opcin 4 o envenos un mensaje a travs de Clinical cytogeneticist.   No podemos decirle cul ser su copago por los medicamentos por adelantado ya que esto es diferente dependiendo de la cobertura de su seguro. Sin embargo, es posible que podamos encontrar un medicamento sustituto a Audiological scientist un formulario para que el seguro cubra el medicamento que se considera necesario.   Si se requiere una autorizacin previa para que su compaa de seguros [place] su medicamento, por favor permtanos de 1 a 2 das hbiles para completar [address]  Los precios de los medicamentos varan con frecuencia dependiendo del Environmental consultant de dnde se surte la receta y alguna farmacias pueden ofrecer precios ms baratos.  El sitio web www.goodrx.com tiene cupones para medicamentos de Health and safety inspector. Los precios aqu no tienen en cuenta lo que podra costar con la ayuda del seguro (puede ser ms barato con su seguro), pero el sitio web puede darle  el precio si no Visual merchandiser.  - Puede imprimir el cupn correspondiente y llevarlo con su receta a la farmacia.  - Tambin puede pasar por nuestra oficina durante el horario de atencin regular y Education officer, museum una tarjeta de cupones de GoodRx.  - Si necesita que su receta se enve electrnicamente a una farmacia diferente, informe a nuestra oficina a travs de MyChart de [place] o por telfono llamando al [phone] y presione la opcin 4.

## 2023-07-28 NOTE — Progress Notes (Signed)
   Follow-Up Visit   Subjective  Derrick Hopkins is a 52 y.o. male who presents for the following: Atopic dermatitis, trunk, extremities The patient has spots, moles and lesions to be evaluated, some may be new or changing and the patient may have concern these could be cancer.   The following portions of the chart were reviewed this encounter and updated as appropriate: medications, allergies, medical history  Review of Systems:  No other skin or systemic complaints except as noted in HPI or Assessment and Plan.  Objective  Well appearing patient in no apparent distress; mood and affect are within normal limits.   A focused examination was performed of the following areas: Trunk, arms  Relevant exam findings are noted in the Assessment and Plan.    Assessment & Plan   ATOPIC NEURODERMATITIS SEVERE WITH PRURIGO NODULARIS, HYPERPIGMENTATION AND EXCORIATIONS Exam: Scaly pink papules coalescing to plaques 80% BSA  Chronic and persistent condition with duration or expected duration over one year. Condition is bothersome/symptomatic for patient. Currently flared.   Atopic dermatitis (eczema) is a chronic, relapsing, pruritic condition that can significantly affect quality of life. It is often associated with allergic rhinitis and/or asthma and can require treatment with topical medications, phototherapy, or in severe cases biologic injectable medication (Dupixent; Adbry) or Oral JAK inhibitors.  Treatment Plan: Cont Dupixent 300mg /63ml sq injections q 2 wks Dupixent 300mg /49ml sq injection to L upper arm, Lot [id number] exp 08/26 Cont Mupirocin oint qd to open sores Cont Tacrolimus 0.1% oint qd/bid aa face Restart TMC 0.1% cr qd up to 5d/wk aa trunk, arms, legs prn flares, avoid f/g/a   Dupilumab (Dupixent) is a treatment given by injection for adults and children with moderate-to-severe atopic dermatitis. Goal is control of skin condition, not cure. It is given as 2 injections at the  first dose followed by 1 injection ever 2 weeks thereafter.  Young children are dosed monthly.  Potential side effects include allergic reaction, herpes infections, injection site reactions and conjunctivitis (inflammation of the eyes).  The use of Dupixent requires long term medication management, including periodic office visits.  Topical steroids (such as triamcinolone, fluocinolone, fluocinonide, mometasone, clobetasol, halobetasol, betamethasone, hydrocortisone) can cause thinning and lightening of the skin if they are used for too long in the same area. Your physician has selected the right strength medicine for your problem and area affected on the body. Please use your medication only as directed by your physician to prevent side effects.    Long term medication management.  Patient is using long term (months to years) prescription medication  to control their dermatologic condition.  These medications require periodic monitoring to evaluate for efficacy and side effects and may require periodic laboratory monitoring.   Recommend gentle skin care.    Atopic neurodermatitis  Related Medications Dupilumab (DUPIXENT) 300 MG/2ML SOAJ Inject 300 mg into the skin every 14 (fourteen) days. Starting at day 15 for maintenance.  dupilumab (DUPIXENT) prefilled syringe 300 mg     Return in about 6 months (around 01/26/2024) for Atopic Derm.  I, Ardis Rowan, RMA, am acting as scribe for Armida Sans, MD .   Documentation: I have reviewed the above documentation for accuracy and completeness, and I agree with the above.  Armida Sans, MD

## 2023-08-03 ENCOUNTER — Ambulatory Visit: Payer: 59 | Admitting: Dermatology

## 2023-08-17 ENCOUNTER — Telehealth: Payer: Self-pay

## 2023-08-17 ENCOUNTER — Other Ambulatory Visit: Payer: Self-pay

## 2023-08-17 NOTE — Telephone Encounter (Signed)
Patient left a voicemail that he still hasn't received his prescription from [facility]. I contacted [facility] and spoke with a representative there and she said that insurance denied the prescription because refill was requested too soon, and that on 08/04/23 [facility] filled the prescription. She said patient would need to contact [facility] and ask them to reverse the claim so that [facility] could fill the prescription, if he didn't receive the prescription on 08/04/23. I spoke with the patient and advised him of this, and he said he never spoke with [facility] and the only pharmacy he spoke with is [place]. I discussed with patient that he could come today and I can give him a month supply to inject at home until we straighten out his prescription. Patient agrees and is coming in today. I gave him [facility]'s phone number so he can contact them about his Dupixent prescription.

## 2023-08-17 NOTE — Progress Notes (Signed)
Error

## 2023-10-20 ENCOUNTER — Encounter (INDEPENDENT_AMBULATORY_CARE_PROVIDER_SITE_OTHER): Payer: Self-pay | Admitting: Nurse Practitioner

## 2023-10-20 ENCOUNTER — Ambulatory Visit (INDEPENDENT_AMBULATORY_CARE_PROVIDER_SITE_OTHER): Payer: Medicaid Other | Admitting: Nurse Practitioner

## 2023-10-20 VITALS — BP 162/73 | HR 51 | Resp 16 | Wt 217.0 lb

## 2023-10-20 DIAGNOSIS — M79661 Pain in right lower leg: Secondary | ICD-10-CM | POA: Diagnosis not present

## 2023-10-20 DIAGNOSIS — M7989 Other specified soft tissue disorders: Secondary | ICD-10-CM

## 2023-10-20 DIAGNOSIS — M5440 Lumbago with sciatica, unspecified side: Secondary | ICD-10-CM

## 2023-10-20 DIAGNOSIS — F17200 Nicotine dependence, unspecified, uncomplicated: Secondary | ICD-10-CM | POA: Diagnosis not present

## 2023-10-20 DIAGNOSIS — G8929 Other chronic pain: Secondary | ICD-10-CM

## 2023-10-20 NOTE — Progress Notes (Unsigned)
 Subjective:    Patient ID: Derrick Hopkins, male    DOB: February 27, 1971, 53 y.o.   MRN: [id number] Chief Complaint  Patient presents with  . New Patient (Initial Visit)    Ref [facility] consult right calf swelling    The patient is a 53 year old male referred by his primary care provider Beverely Low, MD in regards to swelling in his right calf area.  He notes that this issues been going on for nearly a year.  He describes walking for several minutes usually after about 10 or so minutes having pain and tightness and cramping in his right calf area.  He notes that with this pain he also has significant swelling and that he has to sit and rest in order for the swelling to go down.  However instead of it recovering after a few minutes, which is typical claudication-like symptoms, his recovery takes several days for the swelling to subside.  He currently denies any open wounds or ulcerations.   Review of Systems  Cardiovascular:  Positive for leg swelling.  Musculoskeletal:  Positive for back pain and gait problem.  All other systems reviewed and are negative.      Objective:   Physical Exam Vitals reviewed.  HENT:     Head: Normocephalic.  Cardiovascular:     Rate and Rhythm: Normal rate.     Pulses:          Dorsalis pedis pulses are 1+ on the right side.  Pulmonary:     Effort: Pulmonary effort is normal.  Musculoskeletal:     Right lower leg: No edema.  Skin:    General: Skin is warm and dry.  Neurological:     Mental Status: He is alert and oriented to person, place, and time.  Psychiatric:        Mood and Affect: Mood normal.        Behavior: Behavior normal.        Thought Content: Thought content normal.        Judgment: Judgment normal.    BP (!) 162/73   Pulse (!) 51   Resp 16   Wt 217 lb (98.4 kg)   BMI 29.43 kg/m   Past Medical History:  Diagnosis Date  . Alcohol use disorder, mild, abuse   . Arthritis   . Complication of anesthesia    pt thinks he was told he  had some kind of complication but is unsure-will talk with family and will let anesthesia know dos  . COVID-19 2021  . DM type 2 (diabetes mellitus, type 2) (HCC)   . Eczema 06/30/2022   flare up "all over body"  . GERD (gastroesophageal reflux disease)   . History of suicidal ideation   . Hypertension   . Involuntary commitment   . Olecranon bursitis   . Substance induced mood disorder (HCC)   . Tobacco use     Social History   Socioeconomic History  . Marital status: Single    Spouse name: Not on file  . Number of children: Not on file  . Years of education: Not on file  . Highest education level: Not on file  Occupational History  . Not on file  Tobacco Use  . Smoking status: Every Day    Current packs/day: 0.50    Average packs/day: 0.5 packs/day for 26.0 years (13.0 ttl pk-yrs)    Types: Cigarettes  . Smokeless tobacco: Never  . Tobacco comments:    2-3/day   Vaping  Use  . Vaping status: Never Used  Substance and Sexual Activity  . Alcohol use: Yes    Comment: beer and liqour on weekends  . Drug use: Never  . Sexual activity: Not on file  Other Topics Concern  . Not on file  Social History Narrative  . Not on file   Social Drivers of Health   Financial Resource Strain: Not on file  Food Insecurity: Not on file  Transportation Needs: Not on file  Physical Activity: Not on file  Stress: Not on file  Social Connections: Not on file  Intimate Partner Violence: Not on file    Past Surgical History:  Procedure Laterality Date  . EXTERNAL EAR SURGERY    . HERNIA REPAIR      Family History  Problem Relation Age of Onset  . Hypertension Mother   . Diabetes Mother   . Heart attack Father   . Diabetes Brother   . Heart attack Paternal Grandfather     No Known Allergies     Latest Ref Rng & Units 06/04/2023   10:12 PM 03/26/2023   12:09 PM 01/25/2021   11:07 PM  CBC  WBC 4.0 - 10.5 K/uL 11.2  8.0  7.0   Hemoglobin 13.0 - 17.0 g/dL 04.5  40.9  81.1    Hematocrit 39.0 - 52.0 % 42.5  42.5  43.5   Platelets 150 - 400 K/uL 236  238  246       CMP     Component Value Date/Time   NA 135 06/04/2023 2212   NA 141 07/23/2013 0218   K 4.6 06/04/2023 2212   K 3.8 07/23/2013 0218   CL 101 06/04/2023 2212   CL 108 (H) 07/23/2013 0218   CO2 25 06/04/2023 2212   CO2 22 07/23/2013 0218   GLUCOSE 100 (H) 06/04/2023 2212   GLUCOSE 146 (H) 07/23/2013 0218   BUN 11 06/04/2023 2212   BUN 9 07/23/2013 0218   CREATININE 0.91 06/04/2023 2212   CREATININE 0.91 07/23/2013 0218   CALCIUM 8.9 06/04/2023 2212   CALCIUM 9.0 07/23/2013 0218   PROT 7.4 04/20/2020 1104   PROT 8.7 (H) 07/23/2013 0218   ALBUMIN 4.0 04/20/2020 1104   ALBUMIN 4.4 07/23/2013 0218   AST 23 04/20/2020 1104   AST 30 07/23/2013 0218   ALT 20 04/20/2020 1104   ALT 28 07/23/2013 0218   ALKPHOS 45 04/20/2020 1104   ALKPHOS 71 07/23/2013 0218   BILITOT 0.8 04/20/2020 1104   BILITOT 0.2 07/23/2013 0218   GFRNONAA >60 06/04/2023 2212   GFRNONAA >60 07/23/2013 0218     No results found.     Assessment & Plan:   1. Pain and swelling of lower leg, right (Primary)  Recommend:  The patient has atypical pain symptoms for pure atherosclerotic disease. However, on physical exam there is evidence of vascular disease, given the diminished pulses and the edema of the legs.  Further investigation of the patient's vascular disease is necessary to determine the relationship of the patient's lower extremity symptoms and the degree of vascular disease.  Noninvasive studies of the will be obtained and the patient will follow up with me to review these studies.  I suspect the patient is c/o pseudoclaudication.  Patient should have an evaluation of his LS spine which I defer to either the primary service, pain management or the Spine service.  The patient should continue walking and begin a more formal exercise program. The patient should continue his  antiplatelet therapy and  aggressive treatment of the lipid abnormalities.  2. Tobacco use disorder Smoking cessation was discussed, 3-10 minutes spent on this topic specifically  3. Chronic low back pain with sciatica, sciatica laterality unspecified, unspecified back pain laterality I suspect this may be playing a component of the patient's pain.  We can discuss a referral if vascular studies are unrevealing.   Current Outpatient Medications on File Prior to Visit  Medication Sig Dispense Refill  . calcium carbonate (TUMS - DOSED IN MG ELEMENTAL CALCIUM) 500 MG chewable tablet Chew 1 tablet by mouth as needed for indigestion or heartburn.    . Dupilumab (DUPIXENT) 300 MG/2ML SOAJ Inject 300 mg into the skin every 14 (fourteen) days. Starting at day 15 for maintenance. 4 mL 5  . ibuprofen (ADVIL) 800 MG tablet Take 1 tablet (800 mg total) by mouth every 8 (eight) hours as needed. 30 tablet 0  . losartan (COZAAR) 25 MG tablet Take 25 mg by mouth daily before lunch.    . metFORMIN (GLUCOPHAGE) 500 MG tablet Take 500 mg by mouth 2 (two) times daily with a meal.    . mupirocin ointment (BACTROBAN) 2 % Apply 1 Application topically daily. qd to open sores on body until healed 44 g 6  . tacrolimus (PROTOPIC) 0.1 % ointment Apply topically as directed. qd to bid aa eczema on face, groin, axilla as needed for flares 100 g 6  . triamcinolone cream (KENALOG) 0.1 % Apply 1 Application topically as directed. Qd up to 5 days per week to aa eczema on trunk, arms, legs until clear prn flares, avoid face, groin, axilla 453 g 2   No current facility-administered medications on file prior to visit.    There are no Patient Instructions on file for this visit. No follow-ups on file.   Georgiana Spinner, NP

## 2023-11-26 ENCOUNTER — Other Ambulatory Visit (INDEPENDENT_AMBULATORY_CARE_PROVIDER_SITE_OTHER): Payer: Self-pay | Admitting: Nurse Practitioner

## 2023-11-26 DIAGNOSIS — M79661 Pain in right lower leg: Secondary | ICD-10-CM

## 2023-11-27 ENCOUNTER — Ambulatory Visit (INDEPENDENT_AMBULATORY_CARE_PROVIDER_SITE_OTHER): Payer: Medicaid Other | Admitting: Nurse Practitioner

## 2023-11-27 ENCOUNTER — Ambulatory Visit (INDEPENDENT_AMBULATORY_CARE_PROVIDER_SITE_OTHER): Payer: Medicaid Other

## 2023-11-27 ENCOUNTER — Encounter (INDEPENDENT_AMBULATORY_CARE_PROVIDER_SITE_OTHER): Payer: Self-pay | Admitting: Nurse Practitioner

## 2023-11-27 VITALS — BP 144/85 | HR 71 | Resp 16 | Wt 214.0 lb

## 2023-11-27 DIAGNOSIS — M5440 Lumbago with sciatica, unspecified side: Secondary | ICD-10-CM

## 2023-11-27 DIAGNOSIS — M79661 Pain in right lower leg: Secondary | ICD-10-CM | POA: Diagnosis not present

## 2023-11-27 DIAGNOSIS — G8929 Other chronic pain: Secondary | ICD-10-CM

## 2023-11-27 DIAGNOSIS — M7989 Other specified soft tissue disorders: Secondary | ICD-10-CM

## 2023-11-30 ENCOUNTER — Encounter (INDEPENDENT_AMBULATORY_CARE_PROVIDER_SITE_OTHER): Payer: Self-pay | Admitting: Nurse Practitioner

## 2023-11-30 LAB — VAS US ABI WITH/WO TBI
Left ABI: 1.12
Right ABI: 1.24

## 2023-11-30 NOTE — Progress Notes (Signed)
 Subjective:    Patient ID: Derrick Hopkins, male    DOB: [birth year], 53 y.o.   MRN: [id number] Chief Complaint  Patient presents with   Follow-up    Abi and bil venous reflux follow up    The patient is a 53 year old male referred by his primary care provider Beverely Low, MD in regards to swelling in his right calf area.  He notes that this issues been going on for nearly a year.  He describes walking for several minutes usually after about 10 or so minutes having pain and tightness and cramping in his right calf area.  He notes that with this pain he also has significant swelling and that he has to sit and rest in order for the swelling to go down.  However instead of it recovering after a few minutes, which is typical for claudication-like symptoms, his recovery takes several days for the swelling to subside.  He currently denies any open wounds or ulcerations.  He also endorses having significant lower back issues as well.  Today the patient has an ABI of 1.24 on the right and 1.12 on the left.  He has strong triphasic waveforms with normal toe waveforms bilaterally.    Review of Systems  Cardiovascular:  Positive for leg swelling.  Musculoskeletal:  Positive for back pain and gait problem.  All other systems reviewed and are negative.      Objective:   Physical Exam Vitals reviewed.  HENT:     Head: Normocephalic.  Cardiovascular:     Rate and Rhythm: Normal rate.     Pulses:          Dorsalis pedis pulses are 1+ on the right side.  Pulmonary:     Effort: Pulmonary effort is normal.  Musculoskeletal:     Right lower leg: No edema.  Skin:    General: Skin is warm and dry.  Neurological:     Mental Status: He is alert and oriented to person, place, and time.  Psychiatric:        Mood and Affect: Mood normal.        Behavior: Behavior normal.        Thought Content: Thought content normal.        Judgment: Judgment normal.     BP (!) 144/85   Pulse 71   Resp 16    Wt 214 lb (97.1 kg)   BMI 29.02 kg/m   Past Medical History:  Diagnosis Date   Alcohol use disorder, mild, abuse    Arthritis    Complication of anesthesia    pt thinks he was told he had some kind of complication but is unsure-will talk with family and will let anesthesia know dos   COVID-19 2021   DM type 2 (diabetes mellitus, type 2) (HCC)    Eczema 06/30/2022   flare up "all over body"   GERD (gastroesophageal reflux disease)    History of suicidal ideation    Hypertension    Involuntary commitment    Olecranon bursitis    Substance induced mood disorder (HCC)    Tobacco use     Social History   Socioeconomic History   Marital status: Single    Spouse name: Not on file   Number of children: Not on file   Years of education: Not on file   Highest education level: Not on file  Occupational History   Not on file  Tobacco Use   Smoking status: Every Day  Current packs/day: 0.50    Average packs/day: 0.5 packs/day for 26.0 years (13.0 ttl pk-yrs)    Types: Cigarettes   Smokeless tobacco: Never   Tobacco comments:    2-3/day   Vaping Use   Vaping status: Never Used  Substance and Sexual Activity   Alcohol use: Yes    Comment: beer and liqour on weekends   Drug use: Never   Sexual activity: Not on file  Other Topics Concern   Not on file  Social History Narrative   Not on file   Social Drivers of Health   Financial Resource Strain: Not on file  Food Insecurity: Not on file  Transportation Needs: Not on file  Physical Activity: Not on file  Stress: Not on file  Social Connections: Not on file  Intimate Partner Violence: Not on file    Past Surgical History:  Procedure Laterality Date   EXTERNAL EAR SURGERY     HERNIA REPAIR      Family History  Problem Relation Age of Onset   Hypertension Mother    Diabetes Mother    Heart attack Father    Diabetes Brother    Heart attack Paternal Grandfather     No Known Allergies     Latest Ref Rng &  Units 06/04/2023   10:12 PM 03/26/2023   12:09 PM 01/25/2021   11:07 PM  CBC  WBC 4.0 - 10.5 K/uL 11.2  8.0  7.0   Hemoglobin 13.0 - 17.0 g/dL 16.1  09.6  04.5   Hematocrit 39.0 - 52.0 % 42.5  42.5  43.5   Platelets 150 - 400 K/uL 236  238  246       CMP     Component Value Date/Time   NA 135 06/04/2023 2212   NA 141 07/23/2013 0218   K 4.6 06/04/2023 2212   K 3.8 07/23/2013 0218   CL 101 06/04/2023 2212   CL 108 (H) 07/23/2013 0218   CO2 25 06/04/2023 2212   CO2 22 07/23/2013 0218   GLUCOSE 100 (H) 06/04/2023 2212   GLUCOSE 146 (H) 07/23/2013 0218   BUN 11 06/04/2023 2212   BUN 9 07/23/2013 0218   CREATININE 0.91 06/04/2023 2212   CREATININE 0.91 07/23/2013 0218   CALCIUM 8.9 06/04/2023 2212   CALCIUM 9.0 07/23/2013 0218   PROT 7.4 04/20/2020 1104   PROT 8.7 (H) 07/23/2013 0218   ALBUMIN 4.0 04/20/2020 1104   ALBUMIN 4.4 07/23/2013 0218   AST 23 04/20/2020 1104   AST 30 07/23/2013 0218   ALT 20 04/20/2020 1104   ALT 28 07/23/2013 0218   ALKPHOS 45 04/20/2020 1104   ALKPHOS 71 07/23/2013 0218   BILITOT 0.8 04/20/2020 1104   BILITOT 0.2 07/23/2013 0218   GFRNONAA >60 06/04/2023 2212   GFRNONAA >60 07/23/2013 0218     No results found.     Assessment & Plan:   1. Chronic low back pain with sciatica, sciatica laterality unspecified, unspecified back pain laterality (Primary) I suspect that a portion of the patient's issues are related to his back and that some of that is related to his sciatic-like pain.  We will have the patient referred to neurosurgery for further evaluation of his lower back issues.  2. Pain and swelling of lower leg, right We discussed the use of medical grade compression socks.  He is utilize 15 to 20 mmHg daily and place them first thing in the morning and remove them before bedtime.  He is specifically  advised not to sleep and compression socks.  In addition the patient is also given a prescription for compression socks as well.  He is also  advised to elevate his lower extremities to help with swelling as well.    Current Outpatient Medications on File Prior to Visit  Medication Sig Dispense Refill   calcium carbonate (TUMS - DOSED IN MG ELEMENTAL CALCIUM) 500 MG chewable tablet Chew 1 tablet by mouth as needed for indigestion or heartburn.     Dupilumab (DUPIXENT) 300 MG/2ML SOAJ Inject 300 mg into the skin every 14 (fourteen) days. Starting at day 15 for maintenance. 4 mL 5   ibuprofen (ADVIL) 800 MG tablet Take 1 tablet (800 mg total) by mouth every 8 (eight) hours as needed. 30 tablet 0   losartan (COZAAR) 25 MG tablet Take 25 mg by mouth daily before lunch.     metFORMIN (GLUCOPHAGE) 500 MG tablet Take 500 mg by mouth 2 (two) times daily with a meal.     mupirocin ointment (BACTROBAN) 2 % Apply 1 Application topically daily. qd to open sores on body until healed 44 g 6   tacrolimus (PROTOPIC) 0.1 % ointment Apply topically as directed. qd to bid aa eczema on face, groin, axilla as needed for flares 100 g 6   triamcinolone cream (KENALOG) 0.1 % Apply 1 Application topically as directed. Qd up to 5 days per week to aa eczema on trunk, arms, legs until clear prn flares, avoid face, groin, axilla 453 g 2   No current facility-administered medications on file prior to visit.    There are no Patient Instructions on file for this visit. No follow-ups on file.   Georgiana Spinner, NP

## 2023-11-30 NOTE — Progress Notes (Unsigned)
 Referring Physician:  Georgiana Spinner, NP [address] [place],  [place] [address]  Primary Physician:  [facility]  History of Present Illness: 11/30/2023*** Mr. Tyeson Tanimoto has a history of adjustment disorder, alchol use disorder.   Referred here by vascular. Was to start using compression stockings.   Back pain? Leg pain/swelling?   He takes prn advil.   Duration: *** Location: *** Quality: *** Severity: ***  Precipitating: aggravated by *** Modifying factors: made better by *** Weakness: none Timing: ***  He smokes 1/2 PPD x 13*** years.   Bowel/Bladder Dysfunction: none  Conservative measures:  Physical therapy: *** has not participated in? Multimodal medical therapy including regular antiinflammatories: *** ibuprofen Injections: *** no epidural steroid injections?  Past Surgery: ***no spinal surgeries  Graiden A Lecomte has ***no symptoms of cervical myelopathy.  The symptoms are causing a significant impact on the patient's life.   Review of Systems:  A 10 point review of systems is negative, except for the pertinent positives and negatives detailed in the HPI.  Past Medical History: Past Medical History:  Diagnosis Date   Alcohol use disorder, mild, abuse    Arthritis    Complication of anesthesia    pt thinks he was told he had some kind of complication but is unsure-will talk with family and will let anesthesia know dos   COVID-19 2021   DM type 2 (diabetes mellitus, type 2) (HCC)    Eczema 06/30/2022   flare up "all over body"   GERD (gastroesophageal reflux disease)    History of suicidal ideation    Hypertension    Involuntary commitment    Olecranon bursitis    Substance induced mood disorder (HCC)    Tobacco use     Past Surgical History: Past Surgical History:  Procedure Laterality Date   EXTERNAL EAR SURGERY     HERNIA REPAIR      Allergies: Allergies as of 12/03/2023   (No Known Allergies)     Medications: Outpatient Encounter Medications as of 12/03/2023  Medication Sig   calcium carbonate (TUMS - DOSED IN MG ELEMENTAL CALCIUM) 500 MG chewable tablet Chew 1 tablet by mouth as needed for indigestion or heartburn.   Dupilumab (DUPIXENT) 300 MG/2ML SOAJ Inject 300 mg into the skin every 14 (fourteen) days. Starting at day 15 for maintenance.   ibuprofen (ADVIL) 800 MG tablet Take 1 tablet (800 mg total) by mouth every 8 (eight) hours as needed.   losartan (COZAAR) 25 MG tablet Take 25 mg by mouth daily before lunch.   metFORMIN (GLUCOPHAGE) 500 MG tablet Take 500 mg by mouth 2 (two) times daily with a meal.   mupirocin ointment (BACTROBAN) 2 % Apply 1 Application topically daily. qd to open sores on body until healed   tacrolimus (PROTOPIC) 0.1 % ointment Apply topically as directed. qd to bid aa eczema on face, groin, axilla as needed for flares   triamcinolone cream (KENALOG) 0.1 % Apply 1 Application topically as directed. Qd up to 5 days per week to aa eczema on trunk, arms, legs until clear prn flares, avoid face, groin, axilla   No facility-administered encounter medications on file as of 12/03/2023.    Social History: Social History   Tobacco Use   Smoking status: Every Day    Current packs/day: 0.50    Average packs/day: 0.5 packs/day for 26.0 years (13.0 ttl pk-yrs)    Types: Cigarettes   Smokeless tobacco: Never   Tobacco  comments:    2-3/day   Vaping Use   Vaping status: Never Used  Substance Use Topics   Alcohol use: Yes    Comment: beer and liqour on weekends   Drug use: Never    Family Medical History: Family History  Problem Relation Age of Onset   Hypertension Mother    Diabetes Mother    Heart attack Father    Diabetes Brother    Heart attack Paternal Grandfather     Physical Examination: There were no vitals filed for this visit.  General: Patient is well developed, well nourished, calm, collected, and in no apparent distress. Attention to  examination is appropriate.  Respiratory: Patient is breathing without any difficulty.   NEUROLOGICAL:     Awake, alert, oriented to person, place, and time.  Speech is clear and fluent. Fund of knowledge is appropriate.   Cranial Nerves: Pupils equal round and reactive to light.  Facial tone is symmetric.    *** ROM of cervical spine *** pain *** posterior cervical tenderness. *** tenderness in bilateral trapezial region.   *** ROM of lumbar spine *** pain *** posterior lumbar tenderness.   No abnormal lesions on exposed skin.   Strength: Side Biceps Triceps Deltoid Interossei Grip Wrist Ext. Wrist Flex.  R 5 5 5 5 5 5 5   L 5 5 5 5 5 5 5    Side Iliopsoas Quads Hamstring PF DF EHL  R 5 5 5 5 5 5   L 5 5 5 5 5 5    Reflexes are ***2+ and symmetric at the biceps, brachioradialis, patella and achilles.   Hoffman's is absent.  Clonus is not present.   Bilateral upper and lower extremity sensation is intact to light touch.     Gait is normal.   ***No difficulty with tandem gait.    Medical Decision Making  Imaging: No recent lumbar imaging.   Assessment and Plan: Mr. Quam is a pleasant 53 y.o. male has ***  Treatment options discussed with patient and following plan made:   - Order for physical therapy for *** spine ***. Patient to call to schedule appointment. *** - Continue current medications including ***. Reviewed dosing and side effects.  - Prescription for ***. Reviewed dosing and side effects. Take with food.  - Prescription for *** to take prn muscle spasms. Reviewed dosing and side effects. Discussed this can cause drowsiness.  - MRI of *** to further evaluate *** radiculopathy. No improvement time or medications (***).  - Referral to PMR at [facility] to discuss possible *** injections.  - Will schedule phone visit to review MRI results once I get them back.   I spent a total of *** minutes in face-to-face and non-face-to-face activities related to this patient's  care today including review of outside records, review of imaging, review of symptoms, physical exam, discussion of differential diagnosis, discussion of treatment options, and documentation.   Thank you for involving me in the care of this patient.   Drake Leach PA-C Dept. of Neurosurgery

## 2023-12-03 ENCOUNTER — Ambulatory Visit (INDEPENDENT_AMBULATORY_CARE_PROVIDER_SITE_OTHER): Admitting: Orthopedic Surgery

## 2023-12-03 ENCOUNTER — Encounter: Payer: Self-pay | Admitting: Orthopedic Surgery

## 2023-12-03 VITALS — BP 146/84 | Ht 72.0 in | Wt 210.0 lb

## 2023-12-03 DIAGNOSIS — M5416 Radiculopathy, lumbar region: Secondary | ICD-10-CM

## 2023-12-03 DIAGNOSIS — G8929 Other chronic pain: Secondary | ICD-10-CM

## 2023-12-03 DIAGNOSIS — M5441 Lumbago with sciatica, right side: Secondary | ICD-10-CM

## 2023-12-03 DIAGNOSIS — M25561 Pain in right knee: Secondary | ICD-10-CM | POA: Diagnosis not present

## 2023-12-03 DIAGNOSIS — M7989 Other specified soft tissue disorders: Secondary | ICD-10-CM

## 2023-12-03 NOTE — Patient Instructions (Addendum)
 It was so nice to see you today. Thank you so much for coming in.    The pain in your back and right leg may be coming from your back. The pain in the right knee is likely coming from the right knee. I don't think the swelling in your right calf is from your back.   I want to get an MRI of your lower back to look into things further. We will get this approved through your insurance and [facility] will call you to schedule the appointment. Ask about your patient responsibility. You do not need to pay this prior to getting MRI, they can bill you.   [facility] (building with the white pillars) is located off of [address]. The address is [address], [place], [place] [address].    After you have the MRI, it takes 14-21 days for me to get the results back. Once I have them, we will call you to schedule a follow up visit with me to review them.  I want you to see [facility] here at our office for your knee. Dr. Dallas Schimke is great and will take good care of you. They should call you or you can call them at the number below.   Your blood pressure was elevated today. I want you to start checking it regularly at home and follow up with your PCP with the readings. If you have any chest pain, shortness of breath, blurry vision, or headaches then you need to go to ED.    Please do not hesitate to call if you have any questions or concerns. You can also message me in MyChart.   Drake Leach PA-C [phone]     The physicians and staff at [facility] are committed to providing excellent care. You may receive a survey asking for feedback about your experience at our office. We value you your feedback and appreciate you taking the time to to fill it out. The [facility] leadership team is also available to discuss your experience in person, feel free to contact us [phone].

## 2023-12-07 ENCOUNTER — Ambulatory Visit
Admission: RE | Admit: 2023-12-07 | Discharge: 2023-12-07 | Disposition: A | Discharge status: Home / Self Care | Source: Ambulatory Visit | Attending: Orthopedic Surgery | Admitting: Orthopedic Surgery

## 2023-12-07 DIAGNOSIS — G8929 Other chronic pain: Secondary | ICD-10-CM | POA: Diagnosis present

## 2023-12-07 DIAGNOSIS — M5416 Radiculopathy, lumbar region: Secondary | ICD-10-CM | POA: Insufficient documentation

## 2023-12-07 DIAGNOSIS — M5441 Lumbago with sciatica, right side: Secondary | ICD-10-CM | POA: Diagnosis present

## 2023-12-29 ENCOUNTER — Telehealth: Payer: Self-pay

## 2023-12-29 DIAGNOSIS — G8929 Other chronic pain: Secondary | ICD-10-CM

## 2023-12-29 NOTE — Telephone Encounter (Signed)
 Called and spoke to the patient and gave him the information to the facility to have xrays before his appointment tomorrow  he says he will be there

## 2023-12-30 ENCOUNTER — Ambulatory Visit (INDEPENDENT_AMBULATORY_CARE_PROVIDER_SITE_OTHER): Admitting: Orthopedic Surgery

## 2023-12-30 ENCOUNTER — Ambulatory Visit
Admission: RE | Admit: 2023-12-30 | Discharge: 2023-12-30 | Disposition: A | Discharge status: Home / Self Care | Source: Ambulatory Visit | Attending: Orthopedic Surgery

## 2023-12-30 ENCOUNTER — Encounter: Payer: Self-pay | Admitting: Orthopedic Surgery

## 2023-12-30 ENCOUNTER — Ambulatory Visit
Admission: RE | Admit: 2023-12-30 | Discharge: 2023-12-30 | Disposition: A | Discharge status: Home / Self Care | Attending: Orthopedic Surgery | Admitting: Orthopedic Surgery

## 2023-12-30 ENCOUNTER — Telehealth: Payer: Self-pay

## 2023-12-30 VITALS — BP 144/74 | HR 92 | Ht 72.0 in | Wt 220.0 lb

## 2023-12-30 DIAGNOSIS — M1711 Unilateral primary osteoarthritis, right knee: Secondary | ICD-10-CM

## 2023-12-30 DIAGNOSIS — M25561 Pain in right knee: Secondary | ICD-10-CM | POA: Diagnosis present

## 2023-12-30 DIAGNOSIS — G8929 Other chronic pain: Secondary | ICD-10-CM

## 2023-12-30 MED ORDER — IBUPROFEN 600 MG PO TABS
600.0000 mg | ORAL_TABLET | Freq: Three times a day (TID) | ORAL | 0 refills | Status: DC | PRN
Start: 2023-12-30 — End: 2024-02-04

## 2023-12-30 NOTE — Patient Instructions (Addendum)
Instructions Following Joint Injections  In clinic today, you received an injection in one of your joints (sometimes more than one).  Occasionally, you can have some pain at the injection site, this is normal.  You can place ice at the injection site, or take over-the-counter medications such as Tylenol (acetaminophen) or Advil (ibuprofen).  Please follow all directions listed on the bottle.  If your joint (knee or shoulder) becomes swollen, red or very painful, please contact the clinic for additional assistance.   Two medications were injected, including lidocaine and a steroid (often referred to as cortisone).  Lidocaine is effective almost immediately but wears off quickly.  However, the steroid can take a few days to improve your symptoms.  In some cases, it can make your pain worse for a couple of days.  Do not be concerned if this happens as it is common.  You can apply ice or take some over-the-counter medications as needed.   Injections in the same joint cannot be repeated for 3 months.  This helps to limit the risk of an infection in the joint.  If you were to develop an infection in your joint, the best treatment option would be surgery.      Knee Exercises  Ask your health care provider which exercises are safe for you. Do exercises exactly as told by your health care provider and adjust them as directed. It is normal to feel mild stretching, pulling, tightness, or discomfort as you do these exercises. Stop right away if you feel sudden pain or your pain gets worse. Do not begin these exercises until told by your health care provider.  Stretching and range-of-motion exercises These exercises warm up your muscles and joints and improve the movement and flexibility of your knee. These exercises also help to relieve pain and swelling.  Knee extension, prone Lie on your abdomen (prone position) on a bed. Place your left / right knee just beyond the edge of the surface so your knee is  not on the bed. You can put a towel under your left / right thigh just above your kneecap for comfort. Relax your leg muscles and allow gravity to straighten your knee (extension). You should feel a stretch behind your left / right knee. Hold this position for 10 seconds. Scoot up so your knee is supported between repetitions. Repeat 10 times. Complete this exercise 3-4 times per week.     Knee flexion, active Lie on your back with both legs straight. If this causes back discomfort, bend your left / right knee so your foot is flat on the floor. Slowly slide your left / right heel back toward your buttocks. Stop when you feel a gentle stretch in the front of your knee or thigh (flexion). Hold this position for 10 seconds. Slowly slide your left / right heel back to the starting position. Repeat 10 times. Complete this exercise 3-4 times per week.      Quadriceps stretch, prone Lie on your abdomen on a firm surface, such as a bed or padded floor. Bend your left / right knee and hold your ankle. If you cannot reach your ankle or pant leg, loop a belt around your foot and grab the belt instead. Gently pull your heel toward your buttocks. Your knee should not slide out to the side. You should feel a stretch in the front of your thigh and knee (quadriceps). Hold this position for 10 seconds. Repeat 10 times. Complete this exercise 3-4 times per week.  Hamstring, supine Lie on your back (supine position). Loop a belt or towel over the ball of your left / right foot. The ball of your foot is on the walking surface, right under your toes. Straighten your left / right knee and slowly pull on the belt to raise your leg until you feel a gentle stretch behind your knee (hamstring). Do not let your knee bend while you do this. Keep your other leg flat on the floor. Hold this position for 10 seconds. Repeat 10 times. Complete this exercise 3-4 times per week.   Strengthening exercises These  exercises build strength and endurance in your knee. Endurance is the ability to use your muscles for a long time, even after they get tired.  Quadriceps, isometric This exercise stretches the muscles in front of your thigh (quadriceps) without moving your knee joint (isometric). Lie on your back with your left / right leg extended and your other knee bent. Put a rolled towel or small pillow under your knee if told by your health care provider. Slowly tense the muscles in the front of your left / right thigh. You should see your kneecap slide up toward your hip or see increased dimpling just above the knee. This motion will push the back of the knee toward the floor. For 10 seconds, hold the muscle as tight as you can without increasing your pain. Relax the muscles slowly and completely. Repeat 10 times. Complete this exercise 3-4 times per week. .     Straight leg raises This exercise stretches the muscles in front of your thigh (quadriceps) and the muscles that move your hips (hip flexors). Lie on your back with your left / right leg extended and your other knee bent. Tense the muscles in the front of your left / right thigh. You should see your kneecap slide up or see increased dimpling just above the knee. Your thigh may even shake a bit. Keep these muscles tight as you raise your leg 4-6 inches (10-15 cm) off the floor. Do not let your knee bend. Hold this position for 10 seconds. Keep these muscles tense as you lower your leg. Relax your muscles slowly and completely after each repetition. Repeat 10 times. Complete this exercise 3-4 times per week.  Hamstring, isometric Lie on your back on a firm surface. Bend your left / right knee about 30 degrees. Dig your left / right heel into the surface as if you are trying to pull it toward your buttocks. Tighten the muscles in the back of your thighs (hamstring) to "dig" as hard as you can without increasing any pain. Hold this position for 10  seconds. Release the tension gradually and allow your muscles to relax completely for __________ seconds after each repetition. Repeat 10 times. Complete this exercise 3-4 times per week.  Hamstring curls If told by your health care provider, do this exercise while wearing ankle weights. Begin with 5 lb weights. Then increase the weight by 1 lb (0.5 kg) increments. You can also use an exercise band Lie on your abdomen with your legs straight. Bend your left / right knee as far as you can without feeling pain. Keep your hips flat against the floor. Hold this position for 10 seconds. Slowly lower your leg to the starting position. Repeat 10 times. Complete this exercise 3-4 times per week.      Squats This exercise strengthens the muscles in front of your thigh and knee (quadriceps). Stand in front of a table,  with your feet and knees pointing straight ahead. You may rest your hands on the table for balance but not for support. Slowly bend your knees and lower your hips like you are going to sit in a chair. Keep your weight over your heels, not over your toes. Keep your lower legs upright so they are parallel with the table legs. Do not let your hips go lower than your knees. Do not bend lower than told by your health care provider. If your knee pain increases, do not bend as low. Hold the squat position for 10 seconds. Slowly push with your legs to return to standing. Do not use your hands to pull yourself to standing. Repeat 10 times. Complete this exercise 3-4 times per week .     Wall slides This exercise strengthens the muscles in front of your thigh and knee (quadriceps). Lean your back against a smooth wall or door, and walk your feet out 18-24 inches (46-61 cm) from it. Place your feet hip-width apart. Slowly slide down the wall or door until your knees bend 90 degrees. Keep your knees over your heels, not over your toes. Keep your knees in line with your hips. Hold this  position for 10 seconds. Repeat 10 times. Complete this exercise 3-4 times per week.      Straight leg raises This exercise strengthens the muscles that rotate the leg at the hip and move it away from your body (hip abductors). Lie on your side with your left / right leg in the top position. Lie so your head, shoulder, knee, and hip line up. You may bend your bottom knee to help you keep your balance. Roll your hips slightly forward so your hips are stacked directly over each other and your left / right knee is facing forward. Leading with your heel, lift your top leg 4-6 inches (10-15 cm). You should feel the muscles in your outer hip lifting. Do not let your foot drift forward. Do not let your knee roll toward the ceiling. Hold this position for 10 seconds. Slowly return your leg to the starting position. Let your muscles relax completely after each repetition. Repeat 10 times. Complete this exercise 3-4 times per week.      Straight leg raises This exercise stretches the muscles that move your hips away from the front of the pelvis (hip extensors). Lie on your abdomen on a firm surface. You can put a pillow under your hips if that is more comfortable. Tense the muscles in your buttocks and lift your left / right leg about 4-6 inches (10-15 cm). Keep your knee straight as you lift your leg. Hold this position for 10 seconds. Slowly lower your leg to the starting position. Let your leg relax completely after each repetition. Repeat 10 times. Complete this exercise 3-4 times per week.

## 2023-12-30 NOTE — Progress Notes (Unsigned)
 New Patient Visit  Assessment: Derrick Hopkins is a 53 y.o. male with the following: 1. Arthritis of right knee ***   Plan: Muaad A Windle   Follow-up: Return if symptoms worsen or fail to improve.  Subjective:  Chief Complaint  Patient presents with   Knee Pain    Patient in with right knee pain for 1 year or more not taking any medications for it he has to turn the foot out for the knee to stop hurting     History of Present Illness: Derrick Hopkins is a 53 y.o. male who {Presentation:[address]} for evaluation of    Review of Systems: No fevers or chills*** No numbness or tingling No chest pain No shortness of breath No bowel or bladder dysfunction No GI distress No headaches   Medical History:  Past Medical History:  Diagnosis Date   Alcohol use disorder, mild, abuse    Arthritis    Complication of anesthesia    pt thinks he was told he had some kind of complication but is unsure-will talk with family and will let anesthesia know dos   COVID-19 2021   DM type 2 (diabetes mellitus, type 2) (HCC)    Eczema 06/30/2022   flare up "all over body"   GERD (gastroesophageal reflux disease)    History of suicidal ideation    Hypertension    Involuntary commitment    Olecranon bursitis    Substance induced mood disorder (HCC)    Tobacco use     Past Surgical History:  Procedure Laterality Date   EXTERNAL EAR SURGERY     HERNIA REPAIR      Family History  Problem Relation Age of Onset   Hypertension Mother    Diabetes Mother    Heart attack Father    Diabetes Brother    Heart attack Paternal Grandfather    Social History   Tobacco Use   Smoking status: Every Day    Current packs/day: 0.50    Average packs/day: 0.5 packs/day for 26.0 years (13.0 ttl pk-yrs)    Types: Cigarettes   Smokeless tobacco: Never   Tobacco comments:    2-3/day   Vaping Use   Vaping status: Never Used  Substance Use Topics   Alcohol use: Yes    Comment: beer and liqour  on weekends   Drug use: Never    No Known Allergies  Current Meds  Medication Sig   Dupilumab  (DUPIXENT ) 300 MG/2ML SOAJ Inject 300 mg into the skin every 14 (fourteen) days. Starting at day 15 for maintenance.   losartan (COZAAR) 25 MG tablet Take 25 mg by mouth daily before lunch.   metFORMIN (GLUCOPHAGE) 500 MG tablet Take 500 mg by mouth 2 (two) times daily with a meal.   mupirocin  ointment (BACTROBAN ) 2 % Apply 1 Application topically daily. qd to open sores on body until healed   omeprazole (PRILOSEC) 20 MG capsule Take by mouth.   tacrolimus  (PROTOPIC ) 0.1 % ointment Apply topically as directed. qd to bid aa eczema on face, groin, axilla as needed for flares   tadalafil (CIALIS) 10 MG tablet Take 10 mg by mouth as needed.   triamcinolone  cream (KENALOG ) 0.1 % Apply 1 Application topically as directed. Qd up to 5 days per week to aa eczema on trunk, arms, legs until clear prn flares, avoid face, groin, axilla    Objective: BP (!) 144/74   Pulse 92   Ht 6' (1.829 m)   Wt 220 lb (99.8  kg)   BMI 29.84 kg/m   Physical Exam:  General: {General PE Findings:25791} Gait: {Gait:25792}    IMAGING: {XR Reviewed:24899}   New Medications:  No orders of the defined types were placed in this encounter.     Tonita Frater, MD  12/30/2023 4:48 PM

## 2024-01-01 NOTE — Progress Notes (Deleted)
 Referring Physician:  No referring provider defined for this encounter.  Primary Physician:  Center, [facility]  History of Present Illness: 01/01/2024 Mr. Graylin Gaydos has a history of adjustment disorder, alchol use disorder.   Last seen by me on 12/03/23 for LBP and right leg pain. No lumbar imaging.   He was referred to ortho for his right knee- he has has known severe OA. Was started on motrin  and HEP. Discussed injection if no improvement.   He is here to review his lumbar MRI.    Get flex/ext lumbar xrays?***   Referred here by vascular. Was to start using compression stockings.   He has 2 year history of constant LBP with right lateral hip and posterior leg pain to his knee. Pain is worse with standing, walking, and laying in his back. Pain is better with sitting. No relief with grocery cart.   He has swelling of his right calf that is worse with walking/standing.   He takes prn advil  with some relief.   He smokes 1/2 PPD x 23 years.   Bowel/Bladder Dysfunction: none  Conservative measures:  Physical therapy:  has not participated in PT Multimodal medical therapy including regular antiinflammatories:  ibuprofen  Injections:  no epidural steroid injections  Past Surgery: no surgery on his spine  Kobie A Nulph has no symptoms of cervical myelopathy.  The symptoms are causing a significant impact on the patient's life.   Review of Systems:  A 10 point review of systems is negative, except for the pertinent positives and negatives detailed in the HPI.  Past Medical History: Past Medical History:  Diagnosis Date   Alcohol use disorder, mild, abuse    Arthritis    Complication of anesthesia    pt thinks he was told he had some kind of complication but is unsure-will talk with family and will let anesthesia know dos   COVID-19 2021   DM type 2 (diabetes mellitus, type 2) (HCC)    Eczema 06/30/2022   flare up "all over body"   GERD  (gastroesophageal reflux disease)    History of suicidal ideation    Hypertension    Involuntary commitment    Olecranon bursitis    Substance induced mood disorder (HCC)    Tobacco use     Past Surgical History: Past Surgical History:  Procedure Laterality Date   EXTERNAL EAR SURGERY     HERNIA REPAIR      Allergies: Allergies as of 01/05/2024   (No Known Allergies)    Medications: Outpatient Encounter Medications as of 01/05/2024  Medication Sig   Dupilumab  (DUPIXENT ) 300 MG/2ML SOAJ Inject 300 mg into the skin every 14 (fourteen) days. Starting at day 15 for maintenance.   ibuprofen  (ADVIL ) 600 MG tablet Take 1 tablet (600 mg total) by mouth every 8 (eight) hours as needed.   losartan (COZAAR) 25 MG tablet Take 25 mg by mouth daily before lunch.   metFORMIN (GLUCOPHAGE) 500 MG tablet Take 500 mg by mouth 2 (two) times daily with a meal.   mupirocin  ointment (BACTROBAN ) 2 % Apply 1 Application topically daily. qd to open sores on body until healed   omeprazole (PRILOSEC) 20 MG capsule Take by mouth.   tacrolimus  (PROTOPIC ) 0.1 % ointment Apply topically as directed. qd to bid aa eczema on face, groin, axilla as needed for flares   tadalafil (CIALIS) 10 MG tablet Take 10 mg by mouth as needed.   triamcinolone  cream (KENALOG ) 0.1 % Apply 1 Application  topically as directed. Qd up to 5 days per week to aa eczema on trunk, arms, legs until clear prn flares, avoid face, groin, axilla   No facility-administered encounter medications on file as of 01/05/2024.    Social History: Social History   Tobacco Use   Smoking status: Every Day    Current packs/day: 0.50    Average packs/day: 0.5 packs/day for 26.0 years (13.0 ttl pk-yrs)    Types: Cigarettes   Smokeless tobacco: Never   Tobacco comments:    2-3/day   Vaping Use   Vaping status: Never Used  Substance Use Topics   Alcohol use: Yes    Comment: beer and liqour on weekends   Drug use: Never    Family Medical  History: Family History  Problem Relation Age of Onset   Hypertension Mother    Diabetes Mother    Heart attack Father    Diabetes Brother    Heart attack Paternal Grandfather     Physical Examination: There were no vitals filed for this visit.    Awake, alert, oriented to person, place, and time.  Speech is clear and fluent. Fund of knowledge is appropriate.   Cranial Nerves: Pupils equal round and reactive to light.  Facial tone is symmetric.    No lower lumbar tenderness.    No abnormal lesions on exposed skin.   Strength: Side Biceps Triceps Deltoid Interossei Grip Wrist Ext. Wrist Flex.  R 5 5 5 5 5 5 5   L 5 5 5 5 5 5 5    Side Iliopsoas Quads Hamstring PF DF EHL  R 5 5 5 5 5 5   L 5 5 5 5 5 5    Reflexes are 2+ and symmetric at the biceps, brachioradialis, patella and achilles.   Hoffman's is absent. Clonus is not present.   Bilateral upper and lower extremity sensation is intact to light touch.     No pain with IR/ER of both hips.   Mild medial joint line tenderness right knee.   Slight limp favoring right leg with ambulation.   Medical Decision Making  Imaging: Lumbar MRI dated 12/07/23:  FINDINGS: Segmentation: Standard.   Alignment:  Grade 1 anterolisthesis of L4 on L5.   Vertebrae: Vertebral bodies demonstrate normal signal intensity. No compression fractures.   Conus medullaris and cauda equina: The conus medullaris terminates at the level of L1-L2. The distal spinal cord signal intensity is normal.   Paraspinal and other soft tissues: The visualized abdomen and pelvis show no soft tissue abnormality. The visualized aorta is normal.   Disc levels:   L1-L2: Disc is normal in configuration. No facet arthropathy. No neuroforaminal stenosis. No spinal canal stenosis.   L2-L3: Disc is normal in configuration. No facet arthropathy. No neuroforaminal stenosis. No spinal canal stenosis.   L3-L4: Mild disc bulge. Mild bilateral facet arthropathy.  Mild bilateral neuroforaminal stenosis. Mild spinal canal stenosis.   L4-L5: Disc bulge. Severe bilateral facet arthropathy. Moderate bilateral neuroforaminal stenosis. Moderate to severe spinal canal stenosis.   L5-S1: Disc bulge. Mild bilateral facet arthropathy. No neuroforaminal stenosis. No spinal canal stenosis.   IMPRESSION: 1. Moderate to severe canal stenosis at L4-L5 secondary to disc bulging and facet arthropathy. Moderate bilateral foraminal stenosis at this level. 2. Mild canal and foraminal stenoses at L3-L4.     Electronically Signed   By: Johnanna Mylar M.D.   On: 12/30/2023 10:35  I have personally reviewed the images and agree with the above interpretation.   Assessment and  Plan: Mr. Hewitt has a 2 year history of constant LBP with right lateral hip and posterior leg pain to his knee. Pain is worse with standing, walking, and laying in his back.   No lumbar imaging, but LBP and right leg pain appear to be lumbar mediated.   He has right calf swelling with standing/walking- this is not lumbar mediated and is likely vascular.   He has right knee pain that is likely knee mediated.   Treatment options discussed with patient and following plan made:   - MRI of lumbar spine to further evaluate right lumbar radiculopathy.  - Referral to ortho for right knee pain. He will see Dr. Ernesta Heading here at [facility].  - Will schedule follow up visit to review MRI results once I get them back.   BP was elevated. No symptoms of chest pain, shortness of breath, blurry vision, or headaches. He does not check his BP at home.   Recheck was improved. He should still starting checking BP at home and follow up with PCP with readings.   If he develops CP, SOB, blurry vision, or headaches, then he will go to ED.     I spent a total of 35 minutes in face-to-face and non-face-to-face activities related to this patient's care today including review of outside records, review of imaging, review  of symptoms, physical exam, discussion of differential diagnosis, discussion of treatment options, and documentation.   Thank you for involving me in the care of this patient.   Lucetta Russel PA-C Dept. of Neurosurgery

## 2024-01-05 ENCOUNTER — Ambulatory Visit: Admitting: Orthopedic Surgery

## 2024-01-19 ENCOUNTER — Ambulatory Visit: Admitting: Dermatology

## 2024-01-20 ENCOUNTER — Ambulatory Visit: Payer: Medicaid Other | Admitting: Dermatology

## 2024-01-27 ENCOUNTER — Ambulatory Visit: Payer: Medicaid Other | Admitting: Dermatology

## 2024-02-04 ENCOUNTER — Encounter: Payer: Self-pay | Admitting: Orthopedic Surgery

## 2024-02-04 ENCOUNTER — Ambulatory Visit: Admitting: Orthopedic Surgery

## 2024-02-04 VITALS — BP 136/86 | Ht 72.0 in | Wt 220.0 lb

## 2024-02-04 DIAGNOSIS — M4726 Other spondylosis with radiculopathy, lumbar region: Secondary | ICD-10-CM | POA: Diagnosis not present

## 2024-02-04 DIAGNOSIS — M4316 Spondylolisthesis, lumbar region: Secondary | ICD-10-CM

## 2024-02-04 DIAGNOSIS — M48062 Spinal stenosis, lumbar region with neurogenic claudication: Secondary | ICD-10-CM

## 2024-02-04 DIAGNOSIS — M5416 Radiculopathy, lumbar region: Secondary | ICD-10-CM

## 2024-02-04 DIAGNOSIS — M47816 Spondylosis without myelopathy or radiculopathy, lumbar region: Secondary | ICD-10-CM

## 2024-02-04 MED ORDER — MELOXICAM 15 MG PO TABS: 15.0000 mg | ORAL_TABLET | Freq: Every day | ORAL | 1 refills | Status: DC

## 2024-02-04 NOTE — Progress Notes (Signed)
 Referring Physician:  Center, [facility] [address] [place],  [place] [address]  Primary Physician:  Center, [facility]  History of Present Illness: 02/04/2024 Derrick Hopkins has a history of adjustment disorder, alchol use disorder.   Last seen by me on 12/03/23 for LBP and right leg pain. No lumbar imaging.   He was referred to ortho for his right knee- he has has known severe OA. Was started on motrin  and HEP. Discussed injection if no improvement.   He is here to review his lumbar MRI.   He continues with constant LBP and he now has bilateral posterior leg pain to his knees. He has numbness, tingling, and weakness in his legs. Pain is better with leaning forward and with sitting. Pain is worse with standing and walking.   He takes prn advil .  He smokes 1/2 PPD x 23 years.   Bowel/Bladder Dysfunction: none  Conservative measures:  Physical therapy:  has not participated in PT Multimodal medical therapy including regular antiinflammatories:  ibuprofen  Injections:  no epidural steroid injections  Past Surgery: no surgery on his spine  Derrick Hopkins has no symptoms of cervical myelopathy.  The symptoms are causing a significant impact on the patient's life.   Review of Systems:  A 10 point review of systems is negative, except for the pertinent positives and negatives detailed in the HPI.  Past Medical History: Past Medical History:  Diagnosis Date   Alcohol use disorder, mild, abuse    Arthritis    Complication of anesthesia    pt thinks he was told he had some kind of complication but is unsure-will talk with family and will let anesthesia know dos   COVID-19 2021   DM type 2 (diabetes mellitus, type 2) (HCC)    Eczema 06/30/2022   flare up all over body   GERD (gastroesophageal reflux disease)    History of suicidal ideation    Hypertension    Involuntary commitment    Olecranon bursitis    Substance induced mood disorder  (HCC)    Tobacco use     Past Surgical History: Past Surgical History:  Procedure Laterality Date   EXTERNAL EAR SURGERY     HERNIA REPAIR      Allergies: Allergies as of 02/04/2024   (No Known Allergies)    Medications: Outpatient Encounter Medications as of 02/04/2024  Medication Sig   Dupilumab  (DUPIXENT ) 300 MG/2ML SOAJ Inject 300 mg into the skin every 14 (fourteen) days. Starting at day 15 for maintenance.   ibuprofen  (ADVIL ) 600 MG tablet Take 1 tablet (600 mg total) by mouth every 8 (eight) hours as needed.   losartan (COZAAR) 25 MG tablet Take 25 mg by mouth daily before lunch.   metFORMIN (GLUCOPHAGE) 500 MG tablet Take 500 mg by mouth 2 (two) times daily with a meal.   mupirocin  ointment (BACTROBAN ) 2 % Apply 1 Application topically daily. qd to open sores on body until healed   omeprazole (PRILOSEC) 20 MG capsule Take by mouth.   tacrolimus  (PROTOPIC ) 0.1 % ointment Apply topically as directed. qd to bid aa eczema on face, groin, axilla as needed for flares   tadalafil (CIALIS) 10 MG tablet Take 10 mg by mouth as needed.   triamcinolone  cream (KENALOG ) 0.1 % Apply 1 Application topically as directed. Qd up to 5 days per week to aa eczema on trunk, arms, legs until clear prn flares, avoid face, groin, axilla   No facility-administered encounter medications  on file as of 02/04/2024.    Social History: Social History   Tobacco Use   Smoking status: Every Day    Current packs/day: 0.50    Average packs/day: 0.5 packs/day for 26.0 years (13.0 ttl pk-yrs)    Types: Cigarettes   Smokeless tobacco: Never   Tobacco comments:    2-3/day   Vaping Use   Vaping status: Never Used  Substance Use Topics   Alcohol use: Yes    Comment: beer and liqour on weekends   Drug use: Never    Family Medical History: Family History  Problem Relation Age of Onset   Hypertension Mother    Diabetes Mother    Heart attack Father    Diabetes Brother    Heart attack Paternal  Grandfather     Physical Examination: Vitals:   02/04/24 1141  BP: 136/86   Awake, alert, oriented to person, place, and time.  Speech is clear and fluent. Fund of knowledge is appropriate.   Cranial Nerves: Pupils equal round and reactive to light.  Facial tone is symmetric.    No lower lumbar tenderness.    No abnormal lesions on exposed skin.   Strength: Side Iliopsoas Quads Hamstring PF DF EHL  R 5 5 5 5 5 5   L 5 5 5 5 5 5    Reflexes are 2+ and symmetric at the patella and achilles.    Clonus is not present.   Bilateral lower extremity sensation is intact to light touch.     No pain with IR/ER of both hips.   Slight limp favoring right leg with ambulation.   Medical Decision Making  Imaging: Lumbar MRI dated 12/07/23:  FINDINGS: Segmentation: Standard.   Alignment:  Grade 1 anterolisthesis of L4 on L5.   Vertebrae: Vertebral bodies demonstrate normal signal intensity. No compression fractures.   Conus medullaris and cauda equina: The conus medullaris terminates at the level of L1-L2. The distal spinal cord signal intensity is normal.   Paraspinal and other soft tissues: The visualized abdomen and pelvis show no soft tissue abnormality. The visualized aorta is normal.   Disc levels:   L1-L2: Disc is normal in configuration. No facet arthropathy. No neuroforaminal stenosis. No spinal canal stenosis.   L2-L3: Disc is normal in configuration. No facet arthropathy. No neuroforaminal stenosis. No spinal canal stenosis.   L3-L4: Mild disc bulge. Mild bilateral facet arthropathy. Mild bilateral neuroforaminal stenosis. Mild spinal canal stenosis.   L4-L5: Disc bulge. Severe bilateral facet arthropathy. Moderate bilateral neuroforaminal stenosis. Moderate to severe spinal canal stenosis.   L5-S1: Disc bulge. Mild bilateral facet arthropathy. No neuroforaminal stenosis. No spinal canal stenosis.   IMPRESSION: 1. Moderate to severe canal stenosis at L4-L5  secondary to disc bulging and facet arthropathy. Moderate bilateral foraminal stenosis at this level. 2. Mild canal and foraminal stenoses at L3-L4.     Electronically Signed   By: Johnanna Mylar M.D.   On: 12/30/2023 10:35  I have personally reviewed the images and agree with the above interpretation.   Assessment and Plan: Mr. Herter continues with constant LBP and he now has bilateral posterior leg pain to his knees. He has numbness, tingling, and weakness in his legs. Pain is worse with standing and walking.   He has known slip L4-L5 with moderate/severe central stenosis and moderate bilateral foraminal stenosis along with mild central and bilateral foraminal stenosis L3-L4.   LBP likely due to spondylosis. Leg pain is likely due to spinal stenosis.   He  has right knee pain that is likely knee mediated- sees ortho for this.   Treatment options discussed with patient and following plan made:   - PT for lumbar spine ordered at [facility]. He will call to schedule.  - Lumbar injections discussed and he declines.  - Stop motrin . New prescription for mobic . Reviewed dosing and side effects. Take with food.  - Lumbar xrays with flex/ext ordered.  - Follow up phone visit with me in 4-6 weeks to regroup. If no improvement with 6 weeks of PT then will have him follow up with one of the surgeons.  - Note given to support him for food stamps. He has been out of work since 2024 due to back/leg pain.   I spent a total of 20 minutes in face-to-face and non-face-to-face activities related to this patient's care today including review of outside records, review of imaging, review of symptoms, physical exam, discussion of differential diagnosis, discussion of treatment options, and documentation.   Lucetta Russel PA-C Dept. of Neurosurgery

## 2024-02-04 NOTE — Patient Instructions (Signed)
 It was so nice to see you today. Thank you so much for coming in.    You have some wear and tear in your back (arthritis) along with spinal stenosis (pressure on the spinal cord).   I ordered xrays of your lower back. You can get these at [facility] (building with the white pillars) off of [address]. The address is [address], [place], [place] [address]. You do not need any appointment.   I sent physical therapy orders to [facility]. You need to call them at [phone] to schedule your visit.   We have a phone visit scheduled in July. Will review xrays at that time.   Let me know if you are discharged by PT and I will get you in to see one of the surgeons.   Please do not hesitate to call if you have any questions or concerns. You can also message me in MyChart.   Lucetta Russel PA-C [phone]     The physicians and staff at [facility] are committed to providing excellent care. You may receive a survey asking for feedback about your experience at our office. We value you your feedback and appreciate you taking the time to to fill it out. The [facility] leadership team is also available to discuss your experience in person, feel free to contact us  [phone].

## 2024-02-04 NOTE — Progress Notes (Signed)
 error

## 2024-02-24 ENCOUNTER — Ambulatory Visit
Admission: RE | Admit: 2024-02-24 | Discharge: 2024-02-24 | Disposition: A | Discharge status: Home / Self Care | Source: Ambulatory Visit | Attending: Orthopedic Surgery | Admitting: Orthopedic Surgery

## 2024-02-24 ENCOUNTER — Ambulatory Visit
Admission: RE | Admit: 2024-02-24 | Discharge: 2024-02-24 | Disposition: A | Discharge status: Home / Self Care | Attending: Orthopedic Surgery | Admitting: Orthopedic Surgery

## 2024-02-24 DIAGNOSIS — M48062 Spinal stenosis, lumbar region with neurogenic claudication: Secondary | ICD-10-CM

## 2024-02-24 DIAGNOSIS — M47816 Spondylosis without myelopathy or radiculopathy, lumbar region: Secondary | ICD-10-CM | POA: Insufficient documentation

## 2024-02-24 DIAGNOSIS — M4316 Spondylolisthesis, lumbar region: Secondary | ICD-10-CM

## 2024-02-24 DIAGNOSIS — M5416 Radiculopathy, lumbar region: Secondary | ICD-10-CM | POA: Diagnosis present

## 2024-03-04 NOTE — Progress Notes (Unsigned)
 Telephone Visit- Progress Note: Referring Physician:  Center, [facility] [address] [place],  [place] [address]  Primary Physician:  Center, [facility]  This visit was performed via telephone.  Patient location: home Provider location: working from home  I spent a total of 15 minutes non-face-to-face activities for this visit on the date of this encounter including review of current clinical condition and response to treatment.    Patient has given verbal consent to this telephone visits and we reviewed the limitations of a telephone visit. Patient wishes to proceed.    Chief Complaint:  recheck   History of Present Illness: Derrick Hopkins is a 53 y.o. male has a history of  adjustment disorder, alchol use disorder.    Last seen by me on 02/04/24. He has known slip L4-L5 with moderate/severe central stenosis and moderate bilateral foraminal stenosis along with mild central and bilateral foraminal stenosis L3-L4.   He has been seeing ortho for his right knee.   He was sent to PT at last visit and he has not gone. Phone visit scheduled to discuss progress and review lumbar xrays.   He has been calling PT and getting the run around- as above he has not started. He continues with constant LBP and bilateral posterior leg pain to his knees. He has numbness, tingling, and weakness in his legs. Pain is better with leaning forward and with sitting. Pain is worse with prolonged standing.    No relief with mobic  so he stopped it. Did better on motrin  800mg .    He smokes 1/2 PPD x 23 years.    Bowel/Bladder Dysfunction: none   Conservative measures:  Physical therapy:  none Multimodal medical therapy including regular antiinflammatories:  ibuprofen , mobic  Injections:  no epidural steroid injections   Past Surgery: no surgery on his spine   Johncharles A Nicholson has no symptoms of cervical myelopathy.   The symptoms are causing a significant impact on the  patient's life.   Exam: No exam done as this was a telephone encounter.     Imaging: Lumbar xrays dated 02/24/24:  FINDINGS: Five non-rib-bearing lumbar vertebra. 9 mm anterolisthesis of L4 on L5 without change on flexion or extension. Limited range of motion, but no instability. L4-L5 disc space narrowing and spurring. L4-L5 and L5-S1 facet hypertrophy. No fracture or compression deformity.   IMPRESSION: 1. Grade 1 anterolisthesis of L4 on L5 without change on flexion or extension. 2. L4-L5 degenerative disc disease. 3. L4-L5 and L5-S1 facet hypertrophy.     Electronically Signed   By: Andrea Gasman M.D.   On: 02/26/2024 15:52    I have personally reviewed the images and agree with the above interpretation.  Assessment and Plan: Mr. Awbrey continues with constant LBP and bilateral posterior leg pain to his knees. He has numbness, tingling, and weakness in his legs. Pain is worse with prolonged standing.    He has known slip L4-L5 with moderate/severe central stenosis and moderate bilateral foraminal stenosis along with mild central and bilateral foraminal stenosis L3-L4. No instability noted.    LBP likely due to spondylosis. Leg pain is likely due to spinal stenosis.    He has right knee pain that is likely knee mediated- sees ortho for this.    Treatment options discussed with patient and following plan made:    - PT for lumbar spine ordered at [facility].  - Lumbar injections discussed and he declines.  - Stop mobic  New prescription for motrin   800mg . Reviewed dosing and side effects. Take with food.  - New prescription for robaxin to use prn spasms. Reviewed dosing and side effects. He knows this can make him sleepy.  - Discussed he would need to quit smoking prior to any surgery.  - If no improvement with PT, he will need to follow up with one of the surgeons.  - Follow up scheduled with me in 6 weeks.   Glade Boys PA-C Neurosurgery

## 2024-03-11 ENCOUNTER — Ambulatory Visit: Admitting: Orthopedic Surgery

## 2024-03-11 ENCOUNTER — Encounter: Payer: Self-pay | Admitting: Orthopedic Surgery

## 2024-03-11 DIAGNOSIS — M4316 Spondylolisthesis, lumbar region: Secondary | ICD-10-CM | POA: Diagnosis not present

## 2024-03-11 DIAGNOSIS — M47816 Spondylosis without myelopathy or radiculopathy, lumbar region: Secondary | ICD-10-CM

## 2024-03-11 DIAGNOSIS — M5416 Radiculopathy, lumbar region: Secondary | ICD-10-CM

## 2024-03-11 DIAGNOSIS — M48062 Spinal stenosis, lumbar region with neurogenic claudication: Secondary | ICD-10-CM

## 2024-03-11 DIAGNOSIS — M4726 Other spondylosis with radiculopathy, lumbar region: Secondary | ICD-10-CM | POA: Diagnosis not present

## 2024-03-11 MED ORDER — METHOCARBAMOL 500 MG PO TABS: 500.0000 mg | ORAL_TABLET | Freq: Three times a day (TID) | ORAL | 0 refills | Status: AC | PRN

## 2024-03-11 MED ORDER — IBUPROFEN 800 MG PO TABS: 800.0000 mg | ORAL_TABLET | Freq: Three times a day (TID) | ORAL | 0 refills | Status: AC | PRN

## 2024-03-28 ENCOUNTER — Ambulatory Visit: Admitting: Dermatology

## 2024-03-28 ENCOUNTER — Encounter: Payer: Self-pay | Admitting: Dermatology

## 2024-03-28 DIAGNOSIS — L281 Prurigo nodularis: Secondary | ICD-10-CM | POA: Diagnosis not present

## 2024-03-28 DIAGNOSIS — L2081 Atopic neurodermatitis: Secondary | ICD-10-CM

## 2024-03-28 DIAGNOSIS — L209 Atopic dermatitis, unspecified: Secondary | ICD-10-CM

## 2024-03-28 DIAGNOSIS — Z79899 Other long term (current) drug therapy: Secondary | ICD-10-CM | POA: Diagnosis not present

## 2024-03-28 DIAGNOSIS — Z7189 Other specified counseling: Secondary | ICD-10-CM

## 2024-03-28 MED ORDER — DUPIXENT 300 MG/2ML ~~LOC~~ SOAJ: 300.0000 mg | SUBCUTANEOUS | 5 refills | Status: AC

## 2024-03-28 NOTE — Patient Instructions (Addendum)
 Treatment Plan: Continue Dupixent  300mg /39mL injections every 2 wks Continue tacrolimus  0.1% oint 1-2 times daily to face as needed Cont TMC 0.1% cr every day up to 5 days a week to trunk, arms, legs as needed for flares. Avoid applying to face, groin, and axilla. Use as directed. Long-term use can cause thinning of the skin.  Dupilumab  (Dupixent ) is a treatment given by injection for adults and children with moderate-to-severe atopic dermatitis. Goal is control of skin condition, not cure. It is given as 2 injections at the first dose followed by 1 injection ever 2 weeks thereafter.  Young children are dosed monthly.  Potential side effects include allergic reaction, herpes infections, injection site reactions and conjunctivitis (inflammation of the eyes).  The use of Dupixent  requires long term medication management, including periodic office visits.  Due to recent changes in healthcare laws, you may see results of your pathology and/or laboratory studies on MyChart before the doctors have had a chance to review them. We understand that in some cases there may be results that are confusing or concerning to you. Please understand that not all results are received at the same time and often the doctors may need to interpret multiple results in order to provide you with the best plan of care or course of treatment. Therefore, we ask that you please give us  2 business days to thoroughly review all your results before contacting the office for clarification. Should we see a critical lab result, you will be contacted sooner.   If You Need Anything After Your Visit  If you have any questions or concerns for your doctor, please call our main line at [phone] and press option 4 to reach your doctor's medical assistant. If no one answers, please leave a voicemail as directed and we will return your call as soon as possible. Messages left after 4 pm will be answered the following business day.   You may  also send us  a message via MyChart. We typically respond to MyChart messages within 1-2 business days.  For prescription refills, please ask your pharmacy to contact our office. Our fax number is [phone].  If you have an urgent issue when the clinic is closed that cannot wait until the next business day, you can page your doctor at the number below.    Please note that while we do our best to be available for urgent issues outside of office hours, we are not available 24/7.   If you have an urgent issue and are unable to reach us , you may choose to seek medical care at your doctor's office, retail clinic, urgent care center, or emergency room.  If you have a medical emergency, please immediately call 911 or go to the emergency department.  Pager Numbers  - Dr. Hester: [phone]  - Dr. Jackquline: [phone]  - Dr. Claudene: [phone]   In the event of inclement weather, please call our main line at [phone] for an update on the status of any delays or closures.  Dermatology Medication Tips: Please keep the boxes that topical medications come in in order to help keep track of the instructions about where and how to use these. Pharmacies typically print the medication instructions only on the boxes and not directly on the medication tubes.   If your medication is too expensive, please contact our office at [phone] option 4 or send us  a message through MyChart.   We are unable to tell what your co-pay for medications will be in advance  as this is different depending on your insurance coverage. However, we may be able to find a substitute medication at lower cost or fill out paperwork to get insurance to cover a needed medication.   If a prior authorization is required to get your medication covered by your insurance company, please allow us  1-2 business days to complete this process.  Drug prices often vary depending on where the prescription is filled and some  pharmacies may offer cheaper prices.  The website www.goodrx.com contains coupons for medications through different pharmacies. The prices here do not account for what the cost may be with help from insurance (it may be cheaper with your insurance), but the website can give you the price if you did not use any insurance.  - You can print the associated coupon and take it with your prescription to the pharmacy.  - You may also stop by our office during regular business hours and pick up a GoodRx coupon card.  - If you need your prescription sent electronically to a different pharmacy, notify our office through [facility] or by phone at [phone] option 4.     Si Usted Necesita Algo Despus de Su Visita  Tambin puede enviarnos un mensaje a travs de Clinical cytogeneticist. Por lo general respondemos a los mensajes de MyChart en el transcurso de 1 a 2 das hbiles.  Para renovar recetas, por favor pida a su farmacia que se ponga en contacto con nuestra oficina. Randi lakes de fax es [place] [phone].  Si tiene un asunto urgente cuando la clnica est cerrada y que no puede esperar hasta el siguiente da hbil, puede llamar/localizar a su doctor(a) al nmero que aparece a continuacin.   Por favor, tenga en cuenta que aunque hacemos todo lo posible para estar disponibles para asuntos urgentes fuera del horario de [place], no estamos disponibles las 24 horas del da, los 7 [address]  [address] de la [place].   Si tiene un problema urgente y no puede comunicarse con nosotros, puede optar por buscar atencin mdica  en el consultorio de su doctor(a), en una clnica privada, en un centro de atencin urgente o en una sala de emergencias.  Si tiene Engineer, drilling, por favor llame inmediatamente al 911 o vaya a la sala de emergencias.  Nmeros de bper  - Dr. Hester: [phone]  - Dra. Jackquline: [phone]  - Dr. Claudene: [phone]   En caso de inclemencias del tiempo, por favor llame a landry capes principal al [phone] para una actualizacin sobre el [place] de cualquier retraso o cierre.  Consejos para la medicacin en dermatologa: Por favor, guarde las cajas en las que vienen los medicamentos de uso tpico para ayudarle a seguir las instrucciones sobre dnde y cmo usarlos. Las farmacias generalmente imprimen las instrucciones del medicamento slo en las cajas y no directamente en los tubos del [place].   Si su medicamento es muy caro, por favor, pngase en contacto con landry rieger llamando al [phone] y presione la opcin 4 o envenos un mensaje a travs de Clinical cytogeneticist.   No podemos decirle cul ser su copago por los medicamentos por adelantado ya que esto es diferente dependiendo de la cobertura de su seguro. Sin embargo, es posible que podamos encontrar un medicamento sustituto a Audiological scientist un formulario para que el seguro cubra el medicamento que se considera necesario.   Si se requiere una autorizacin previa para que su compaa de seguros malta su medicamento, por favor permtanos de 1 a 2  das hbiles para completar este proceso.  Los precios de los medicamentos varan con frecuencia dependiendo del Environmental consultant de dnde se surte la receta y alguna farmacias pueden ofrecer precios ms baratos.  El sitio web www.goodrx.com tiene cupones para medicamentos de Health and safety inspector. Los precios aqu no tienen en cuenta lo que podra costar con la ayuda del seguro (puede ser ms barato con su seguro), pero el sitio web puede darle el precio si no utiliz Tourist information centre manager.  - Puede imprimir el cupn correspondiente y llevarlo con su receta a la farmacia.  - Tambin puede pasar por nuestra oficina durante el horario de atencin regular y Education officer, museum una tarjeta de cupones de GoodRx.  - Si necesita que su receta se enve electrnicamente a una farmacia diferente, informe a nuestra oficina a travs de MyChart de [place] o por telfono llamando al [phone] y presione la  opcin 4.

## 2024-03-28 NOTE — Progress Notes (Signed)
   Follow-Up Visit   Subjective  Derrick Hopkins is a 53 y.o. male who presents for the following: atopic dermatitis. Patient was on Dupixent  but did not make his follow up appt because he was incarcerated for child support. Patient's last injection was about 3 weeks ago. Patient was self-injecting and was improved on Dupixent . Also using tacrolimus  and has TMC for flares. Patient is flared at back today.   The following portions of the chart were reviewed this encounter and updated as appropriate: medications, allergies, medical history  Review of Systems:  No other skin or systemic complaints except as noted in HPI or Assessment and Plan.  Objective  Well appearing patient in no apparent distress; mood and affect are within normal limits.   A focused examination was performed of the following areas: Sun exposed  Relevant exam findings are noted in the Assessment and Plan.    Assessment & Plan   ATOPIC DERMATITIS and prurigo nodularis Exam: clear on dupixent   controlled  Atopic dermatitis (eczema) is a chronic, relapsing, pruritic condition that can significantly affect quality of life. It is often associated with allergic rhinitis and/or asthma and can require treatment with topical medications, phototherapy, or in severe cases biologic injectable medication (Dupixent ; Adbry) or Oral JAK inhibitors.  Treatment Plan: Continue Dupixent  300mg /39mL sq injections q 2 wks Continue tacrolimus  0.1% oint every day/bid aa face Cont TMC 0.1% cr every day up to 5 d/wk aa trunk, arms, legs prn flares. Avoid applying to face, groin, and axilla. Use as directed. Long-term use can cause thinning of the skin.  Sample x 2 of Dupixent  given to patient to self-inject at home.  Lot # [id number]   Exp: 08/24/2025  Recommend gentle skin care.  Dupilumab  (Dupixent ) is a treatment given by injection for adults and children with moderate-to-severe atopic dermatitis. Goal is control of skin condition, not  cure. It is given as 2 injections at the first dose followed by 1 injection ever 2 weeks thereafter.  Young children are dosed monthly.  Potential side effects include allergic reaction, herpes infections, injection site reactions and conjunctivitis (inflammation of the eyes).  The use of Dupixent  requires long term medication management, including periodic office visits.  ATOPIC NEURODERMATITIS   Related Medications Dupilumab  (DUPIXENT ) 300 MG/2ML SOAJ Inject 300 mg into the skin every 14 (fourteen) days. Starting at day 15 for maintenance. LONG-TERM USE OF HIGH-RISK MEDICATION   COUNSELING AND COORDINATION OF CARE   MEDICATION MANAGEMENT   PRURIGO NODULARIS    Return in about 6 months (around 09/28/2024) for Atopic Dermatitis, with Dr. MARLA, Dupixent .  LILLETTE Lonell Drones, RMA, am acting as scribe for Boneta Sharps, MD .   Documentation: I have reviewed the above documentation for accuracy and completeness, and I agree with the above.  Boneta Sharps, MD

## 2024-03-31 ENCOUNTER — Other Ambulatory Visit: Payer: Self-pay

## 2024-03-31 ENCOUNTER — Emergency Department

## 2024-03-31 ENCOUNTER — Emergency Department
Admission: EM | Admit: 2024-03-31 | Discharge: 2024-03-31 | Disposition: A | Discharge status: Home / Self Care | Attending: Emergency Medicine | Admitting: Emergency Medicine

## 2024-03-31 DIAGNOSIS — R0602 Shortness of breath: Secondary | ICD-10-CM | POA: Diagnosis present

## 2024-03-31 DIAGNOSIS — T50905A Adverse effect of unspecified drugs, medicaments and biological substances, initial encounter: Secondary | ICD-10-CM | POA: Diagnosis not present

## 2024-03-31 DIAGNOSIS — I10 Essential (primary) hypertension: Secondary | ICD-10-CM | POA: Diagnosis not present

## 2024-03-31 LAB — CBC WITH DIFFERENTIAL/PLATELET
Abs Immature Granulocytes: 0.03 K/uL (ref 0.00–0.07)
Basophils Absolute: 0 K/uL (ref 0.0–0.1)
Basophils Relative: 1 %
Eosinophils Absolute: 0.1 K/uL (ref 0.0–0.5)
Eosinophils Relative: 1 %
HCT: 46.7 % (ref 39.0–52.0)
Hemoglobin: 16.1 g/dL (ref 13.0–17.0)
Immature Granulocytes: 0 %
Lymphocytes Relative: 40 %
Lymphs Abs: 3.5 K/uL (ref 0.7–4.0)
MCH: 34 pg (ref 26.0–34.0)
MCHC: 34.5 g/dL (ref 30.0–36.0)
MCV: 98.5 fL (ref 80.0–100.0)
Monocytes Absolute: 0.9 K/uL (ref 0.1–1.0)
Monocytes Relative: 10 %
Neutro Abs: 4.3 K/uL (ref 1.7–7.7)
Neutrophils Relative %: 48 %
Platelets: 243 K/uL (ref 150–400)
RBC: 4.74 MIL/uL (ref 4.22–5.81)
RDW: 13.2 % (ref 11.5–15.5)
WBC: 8.9 K/uL (ref 4.0–10.5)
nRBC: 0 % (ref 0.0–0.2)

## 2024-03-31 LAB — BASIC METABOLIC PANEL WITH GFR
Anion gap: 15 (ref 5–15)
BUN: 16 mg/dL (ref 6–20)
CO2: 22 mmol/L (ref 22–32)
Calcium: 9.3 mg/dL (ref 8.9–10.3)
Chloride: 106 mmol/L (ref 98–111)
Creatinine, Ser: 1.06 mg/dL (ref 0.61–1.24)
GFR, Estimated: >60 mL/min (ref >60)
Glucose, Bld: 79 mg/dL (ref 70–99)
Potassium: 4 mmol/L (ref 3.5–5.1)
Sodium: 143 mmol/L (ref 135–145)

## 2024-03-31 NOTE — ED Triage Notes (Signed)
 Pt reports he took a sex enhancement pill his friend got off the internet and aprox 20 min after pt became diaphoretic shaky and sob. Pt denies chest pain

## 2024-03-31 NOTE — ED Provider Notes (Signed)
 [facility] Provider Note   Event Date/Time   First MD Initiated Contact with Patient 03/31/24 2155     (approximate) History  Shortness of Breath  HPI Derrick Hopkins is a 53 y.o. male with a stated past medical history of alcohol abuse, suicidal ideation, hypertension, and GERD who presents after taking an unknown sexual enhancement pill off the Internet and becoming diaphoretic, tremulous, and short of breath.  Patient denies any associated shortness of breath or dyspnea with exertion.  Patient states that the symptoms lasted approximately 1 hour and resolved spontaneously.  Patient denies any of the symptoms during our interview. ROS: Patient currently denies any vision changes, tinnitus, difficulty speaking, facial droop, sore throat, chest pain, shortness of breath, abdominal pain, nausea/vomiting/diarrhea, dysuria, or weakness/numbness/paresthesias in any extremity   Physical Exam  Triage Vital Signs: ED Triage Vitals  Encounter Vitals Group     BP 03/31/24 1936 (!) 174/81     Girls Systolic BP Percentile --      Girls Diastolic BP Percentile --      Boys Systolic BP Percentile --      Boys Diastolic BP Percentile --      Pulse Rate 03/31/24 1936 77     Resp 03/31/24 1936 20     Temp 03/31/24 1936 98.3 F (36.8 C)     Temp Source 03/31/24 1936 Oral     SpO2 03/31/24 1936 99 %     Weight 03/31/24 1935 220 lb (99.8 kg)     Height 03/31/24 1935 6' (1.829 m)     Head Circumference --      Peak Flow --      Pain Score 03/31/24 1935 0     Pain Loc --      Pain Education --      Exclude from Growth Chart --    Most recent vital signs: Vitals:   03/31/24 1936  BP: (!) 174/81  Pulse: 77  Resp: 20  Temp: 98.3 F (36.8 C)  SpO2: 99%   General: Awake, oriented x4. CV:  Good peripheral perfusion. Resp:  Normal effort. Abd:  No distention. Other:  Middle-aged overweight African-American male resting comfortably in no acute distress ED Results /  Procedures / Treatments  Labs (all labs ordered are listed, but only abnormal results are displayed) Labs Reviewed  CBC WITH DIFFERENTIAL/PLATELET  BASIC METABOLIC PANEL WITH GFR   EKG ED ECG REPORT I, Artist MARLA Kerns, the attending physician, personally viewed and interpreted this ECG. Date: 03/31/2024 EKG Time: 1938 Rate: 78 Rhythm: normal sinus rhythm QRS Axis: normal Intervals: normal ST/T Wave abnormalities: normal Narrative Interpretation: no evidence of acute ischemia RADIOLOGY ED MD interpretation: 2 view chest x-ray shows possible developing lingular infiltrate - All radiology independently interpreted and agree with radiology assessment Official radiology report(s): DG Chest 2 View Result Date: 03/31/2024 CLINICAL DATA:  Shortness of breath. EXAM: CHEST - 2 VIEW COMPARISON:  Chest radiograph dated 07/10/2021. FINDINGS: An area of airspace density in the lingula may represent developing infiltrate. The right lung is clear. No pleural effusion or pneumothorax. The cardiac silhouette is within normal limits. No acute osseous pathology. IMPRESSION: Possible developing lingular infiltrate. Follow-up to resolution recommended. Electronically Signed   By: Vanetta Chou M.D.   On: 03/31/2024 20:03   PROCEDURES: Critical Care performed: No Procedures MEDICATIONS ORDERED IN ED: Medications - No data to display IMPRESSION / MDM / ASSESSMENT AND PLAN / ED COURSE  I reviewed the triage vital  signs and the nursing notes.                             The patient is on the cardiac monitor to evaluate for evidence of arrhythmia and/or significant heart rate changes. Patient's presentation is most consistent with acute presentation with potential threat to life or bodily function. The patient is suffering from shortness of breath, but the immediate cause is not apparent.  Potential causes considered include, but are not limited to, asthma or COPD, congestive heart failure, pulmonary  embolism, pneumothorax, coronary syndrome, pneumonia, and pleural effusion.  Despite the evaluation including history, exam, and testing, the cause of the shortness of breath remains unclear.  Given chest x-ray finding, patient was questioned and denies any fevers, shortness of breath, chest pain, productive cough before the events of today.  However, during the ED stay, patient's condition improved, and at the time of discharge the shortness of breath is resolved, they are feeling well, and want to go home.  Patient will be discharged with strict return precautions and advice to follow up with primary MD within 24 hours for further evaluation.   FINAL CLINICAL IMPRESSION(S) / ED DIAGNOSES   Final diagnoses:  SOB (shortness of breath)  Medication side effect, initial encounter   Rx / DC Orders   ED Discharge Orders     None      Note:  This document was prepared using Dragon voice recognition software and may include unintentional dictation errors.   Jossie Artist POUR, MD 03/31/24 2216

## 2024-04-25 NOTE — Progress Notes (Deleted)
 Referring Physician:  Center, [facility] [address] [place],  [place] [address]  Primary Physician:  Center, [facility]  History of Present Illness: 04/25/2024 [name] has a history of adjustment disorder, alchol use disorder.   Last seen by me for phone visit on 03/11/24 for back and bilateral leg pain. He has known slip L4-L5 with moderate/severe central stenosis and moderate bilateral foraminal stenosis along with mild central and bilateral foraminal stenosis L3-L4. No instability noted.   He was sent to PT (initial visit on 03/23/24) and was started on motrin  and robaxin . He declined injections. He was to quit smoking.   He was seeing ortho for right knee pain.   He is here for follow up.       He has been calling PT and getting the run around- as above he has not started. He continues with constant LBP and bilateral posterior leg pain to his knees. He has numbness, tingling, and weakness in his legs. Pain is better with leaning forward and with sitting. Pain is worse with prolonged standing.    No relief with mobic  so he stopped it. Did better on motrin  800mg .    He smokes 1/2 PPD x 23 years.    Bowel/Bladder Dysfunction: none  Bowel/Bladder Dysfunction: none  Conservative measures:  Physical therapy:  initial eval 03/23/24 at [facility] Multimodal medical therapy including regular antiinflammatories:  ibuprofen  Injections:  no epidural steroid injections  Past Surgery: no surgery on his spine  Derrick Hopkins has no symptoms of cervical myelopathy.  The symptoms are causing a significant impact on the patient's life.   Review of Systems:  A 10 point review of systems is negative, except for the pertinent positives and negatives detailed in the HPI.  Past Medical History: Past Medical History:  Diagnosis Date   Alcohol use disorder, mild, abuse    Arthritis    Complication of anesthesia    pt thinks he was told he  had some kind of complication but is unsure-will talk with family and will let anesthesia know dos   COVID-19 2021   DM type 2 (diabetes mellitus, type 2) (HCC)    Eczema 06/30/2022   flare up all over body   GERD (gastroesophageal reflux disease)    History of suicidal ideation    Hypertension    Involuntary commitment    Olecranon bursitis    Substance induced mood disorder (HCC)    Tobacco use     Past Surgical History: Past Surgical History:  Procedure Laterality Date   EXTERNAL EAR SURGERY     HERNIA REPAIR      Allergies: Allergies as of 04/27/2024   (No Known Allergies)    Medications: Outpatient Encounter Medications as of 04/27/2024  Medication Sig   Dupilumab  (DUPIXENT ) 300 MG/2ML SOAJ Inject 300 mg into the skin every 14 (fourteen) days. Starting at day 15 for maintenance.   ibuprofen  (ADVIL ) 800 MG tablet Take 1 tablet (800 mg total) by mouth 3 (three) times daily with meals as needed for mild pain (pain score 1-3).   losartan (COZAAR) 25 MG tablet Take 25 mg by mouth daily before lunch.   metFORMIN (GLUCOPHAGE) 500 MG tablet Take 500 mg by mouth 2 (two) times daily with a meal.   methocarbamol  (ROBAXIN ) 500 MG tablet Take 1 tablet (500 mg total) by mouth every 8 (eight) hours as needed for muscle spasms. This can make you sleepy.   mupirocin  ointment (BACTROBAN ) 2 %  Apply 1 Application topically daily. qd to open sores on body until healed   omeprazole (PRILOSEC) 20 MG capsule Take by mouth.   tacrolimus  (PROTOPIC ) 0.1 % ointment Apply topically as directed. qd to bid aa eczema on face, groin, axilla as needed for flares   tadalafil (CIALIS) 10 MG tablet Take 10 mg by mouth as needed.   triamcinolone  cream (KENALOG ) 0.1 % Apply 1 Application topically as directed. Qd up to 5 days per week to aa eczema on trunk, arms, legs until clear prn flares, avoid face, groin, axilla   No facility-administered encounter medications on file as of 04/27/2024.    Social  History: Social History   Tobacco Use   Smoking status: Every Day    Current packs/day: 0.50    Average packs/day: 0.5 packs/day for 26.0 years (13.0 ttl pk-yrs)    Types: Cigarettes   Smokeless tobacco: Never   Tobacco comments:    2-3/day   Vaping Use   Vaping status: Never Used  Substance Use Topics   Alcohol use: Yes    Comment: beer and liqour on weekends   Drug use: Never    Family Medical History: Family History  Problem Relation Age of Onset   Hypertension Mother    Diabetes Mother    Heart attack Father    Diabetes Brother    Heart attack Paternal Grandfather     Physical Examination: There were no vitals filed for this visit.  Awake, alert, oriented to person, place, and time.  Speech is clear and fluent. Fund of knowledge is appropriate.   Cranial Nerves: Pupils equal round and reactive to light.  Facial tone is symmetric.    No lower lumbar tenderness.    No abnormal lesions on exposed skin.   Strength: Side Iliopsoas Quads Hamstring PF DF EHL  R 5 5 5 5 5 5   L 5 5 5 5 5 5    Reflexes are 2+ and symmetric at the patella and achilles.    Clonus is not present.   Bilateral lower extremity sensation is intact to light touch.     No pain with IR/ER of both hips.   Slight limp favoring right leg with ambulation.   Medical Decision Making  Imaging: none   Assessment and Plan: Derrick Hopkins continues with constant LBP and bilateral posterior leg pain to his knees. He has numbness, tingling, and weakness in his legs. Pain is worse with prolonged standing.    He has known slip L4-L5 with moderate/severe central stenosis and moderate bilateral foraminal stenosis along with mild central and bilateral foraminal stenosis L3-L4. No instability noted.    LBP likely due to spondylosis. Leg pain is likely due to spinal stenosis.    He has right knee pain that is likely knee mediated- sees ortho for this.    Treatment options discussed with patient and  following plan made:    - PT for lumbar spine ordered at [facility].  - Lumbar injections discussed and he declines.  - Stop mobic  New prescription for motrin  800mg . Reviewed dosing and side effects. Take with food.  - New prescription for robaxin  to use prn spasms. Reviewed dosing and side effects. He knows this can make him sleepy.  - Discussed he would need to quit smoking prior to any surgery.  - If no improvement with PT, he will need to follow up with one of the surgeons.  - Follow up scheduled with me in 6 weeks.   I spent a total  of *** minutes in face-to-face and non-face-to-face activities related to this patient's care today including review of outside records, review of imaging, review of symptoms, physical exam, discussion of differential diagnosis, discussion of treatment options, and documentation.   Glade Boys PA-C Dept. of Neurosurgery

## 2024-04-27 ENCOUNTER — Ambulatory Visit: Admitting: Orthopedic Surgery

## 2024-05-22 ENCOUNTER — Emergency Department

## 2024-05-22 ENCOUNTER — Other Ambulatory Visit: Payer: Self-pay

## 2024-05-22 ENCOUNTER — Emergency Department
Admission: EM | Admit: 2024-05-22 | Discharge: 2024-05-22 | Disposition: A | Discharge status: Home / Self Care | Attending: Emergency Medicine | Admitting: Emergency Medicine

## 2024-05-22 DIAGNOSIS — X501XXA Overexertion from prolonged static or awkward postures, initial encounter: Secondary | ICD-10-CM | POA: Diagnosis not present

## 2024-05-22 DIAGNOSIS — S43014A Anterior dislocation of right humerus, initial encounter: Secondary | ICD-10-CM | POA: Insufficient documentation

## 2024-05-22 DIAGNOSIS — S4991XA Unspecified injury of right shoulder and upper arm, initial encounter: Secondary | ICD-10-CM | POA: Diagnosis present

## 2024-05-22 MED ORDER — LORAZEPAM 2 MG PO TABS
2.0000 mg | ORAL_TABLET | Freq: Once | ORAL | Status: AC
Start: 2024-05-22 — End: 2024-05-22
  Administered 2024-05-22: 2 mg via ORAL
  Filled 2024-05-22: qty 1

## 2024-05-22 MED ORDER — FENTANYL CITRATE PF 50 MCG/ML IJ SOSY
100.0000 ug | PREFILLED_SYRINGE | Freq: Once | INTRAMUSCULAR | Status: AC
  Administered 2024-05-22: 100 ug via NASAL
  Filled 2024-05-22: qty 2
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
## 2024-05-22 NOTE — ED Notes (Signed)
 Pt is in lobby asking [facility] for pain meds.

## 2024-05-22 NOTE — Discharge Instructions (Addendum)
 Keep your arm in the sling for comfort as needed.  Call orthopedist Dr. Lorelle for an appointment-sometimes after shoulder dislocations are put back in there can be some bone injuries that need to be further assessed by this bone specialist.  Call for an appointment.  Take acetaminophen  650 mg and ibuprofen  400 mg every 6 hours for pain.  Take with food.   Thank you for choosing us  for your health care today!  Please see your primary doctor this week for a follow up appointment.   If you have any new, worsening, or unexpected symptoms call your doctor right away or come back to the emergency department for reevaluation.  It was my pleasure to care for you today.   Ginnie EDISON Cyrena, MD
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
## 2024-06-02 ENCOUNTER — Encounter: Payer: Self-pay | Admitting: Orthopedic Surgery

## 2024-06-29 ENCOUNTER — Other Ambulatory Visit: Payer: Self-pay

## 2024-06-29 DIAGNOSIS — M272 Inflammatory conditions of jaws: Secondary | ICD-10-CM | POA: Insufficient documentation

## 2024-06-29 DIAGNOSIS — L03211 Cellulitis of face: Secondary | ICD-10-CM | POA: Diagnosis not present

## 2024-06-29 DIAGNOSIS — R6884 Jaw pain: Secondary | ICD-10-CM | POA: Diagnosis present

## 2024-06-29 DIAGNOSIS — Z7984 Long term (current) use of oral hypoglycemic drugs: Secondary | ICD-10-CM | POA: Insufficient documentation

## 2024-06-29 DIAGNOSIS — E119 Type 2 diabetes mellitus without complications: Secondary | ICD-10-CM | POA: Insufficient documentation

## 2024-06-29 NOTE — ED Triage Notes (Signed)
 Pt reports he has had dental pain on the lower right, pt states today he woke up and noted his lower jaw to be swollen.

## 2024-06-30 ENCOUNTER — Emergency Department
Admission: EM | Admit: 2024-06-30 | Discharge: 2024-06-30 | Disposition: A | Discharge status: Home / Self Care | Attending: Emergency Medicine | Admitting: Emergency Medicine

## 2024-06-30 ENCOUNTER — Emergency Department

## 2024-06-30 DIAGNOSIS — M272 Inflammatory conditions of jaws: Secondary | ICD-10-CM

## 2024-06-30 DIAGNOSIS — L03211 Cellulitis of face: Secondary | ICD-10-CM

## 2024-06-30 LAB — CBC WITH DIFFERENTIAL/PLATELET
Abs Immature Granulocytes: 0.02 K/uL (ref 0.00–0.07)
Basophils Absolute: 0 K/uL (ref 0.0–0.1)
Basophils Relative: 0 %
Eosinophils Absolute: 0.1 K/uL (ref 0.0–0.5)
Eosinophils Relative: 2 %
HCT: 37.3 % — ABNORMAL LOW (ref 39.0–52.0)
Hemoglobin: 12.7 g/dL — ABNORMAL LOW (ref 13.0–17.0)
Immature Granulocytes: 0 %
Lymphocytes Relative: 26 %
Lymphs Abs: 1.8 K/uL (ref 0.7–4.0)
MCH: 34.6 pg — ABNORMAL HIGH (ref 26.0–34.0)
MCHC: 34 g/dL (ref 30.0–36.0)
MCV: 101.6 fL — ABNORMAL HIGH (ref 80.0–100.0)
Monocytes Absolute: 0.7 K/uL (ref 0.1–1.0)
Monocytes Relative: 10 %
Neutro Abs: 4.4 K/uL (ref 1.7–7.7)
Neutrophils Relative %: 62 %
Platelets: 219 K/uL (ref 150–400)
RBC: 3.67 MIL/uL — ABNORMAL LOW (ref 4.22–5.81)
RDW: 13.2 % (ref 11.5–15.5)
WBC: 7 K/uL (ref 4.0–10.5)
nRBC: 0 % (ref 0.0–0.2)

## 2024-06-30 LAB — BASIC METABOLIC PANEL WITH GFR
Anion gap: 10 (ref 5–15)
BUN: 13 mg/dL (ref 6–20)
CO2: 23 mmol/L (ref 22–32)
Calcium: 8.4 mg/dL — ABNORMAL LOW (ref 8.9–10.3)
Chloride: 107 mmol/L (ref 98–111)
Creatinine, Ser: 0.9 mg/dL (ref 0.61–1.24)
GFR, Estimated: >60 mL/min (ref >60)
Glucose, Bld: 125 mg/dL — ABNORMAL HIGH (ref 70–99)
Potassium: 3.9 mmol/L (ref 3.5–5.1)
Sodium: 140 mmol/L (ref 135–145)

## 2024-06-30 MED ORDER — IOHEXOL 300 MG/ML  SOLN
75.0000 mL | Freq: Once | INTRAMUSCULAR | Status: AC | PRN
Start: 2024-06-30 — End: 2024-06-30
  Administered 2024-06-30: 75 mL via INTRAVENOUS

## 2024-06-30 MED ORDER — DEXAMETHASONE SOD PHOSPHATE PF 10 MG/ML IJ SOLN
10.0000 mg | Freq: Once | INTRAMUSCULAR | Status: AC
  Administered 2024-06-30: 10 mg via INTRAVENOUS

## 2024-06-30 MED ORDER — HYDROCODONE-ACETAMINOPHEN 5-325 MG PO TABS: 2.0000 | ORAL_TABLET | Freq: Four times a day (QID) | ORAL | 0 refills | Status: AC | PRN

## 2024-06-30 MED ORDER — KETOROLAC TROMETHAMINE 30 MG/ML IJ SOLN
15.0000 mg | Freq: Once | INTRAMUSCULAR | Status: AC
  Administered 2024-06-30: 15 mg via INTRAVENOUS
  Filled 2024-06-30: qty 1

## 2024-06-30 MED ORDER — AMOXICILLIN-POT CLAVULANATE 875-125 MG PO TABS: 1.0000 | ORAL_TABLET | Freq: Two times a day (BID) | ORAL | 0 refills | Status: AC

## 2024-06-30 MED ORDER — SODIUM CHLORIDE 0.9 % IV SOLN
3.0000 g | INTRAVENOUS | Status: AC
  Administered 2024-06-30: 3 g via INTRAVENOUS
  Filled 2024-06-30: qty 8

## 2024-06-30 NOTE — ED Provider Notes (Signed)
 [facility] Provider Note    Event Date/Time   First MD Initiated Contact with Patient 06/30/24 0013     (approximate)   History   Dental Pain   HPI Derrick Hopkins is a 53 y.o. male with history of diabetes on metformin who presents for evaluation of acute onset pain and swelling to the right part of his lower jaw.  He said that he knows he has some chronic dental problems but has not been suffering from any issues recently.  All the sudden tonight a few hours ago he was having pain and swelling towards the bottom of the right side of his lower jaw with swelling and tightness in that side of his face.  He also said that the right side of his lower lip and cheek feels numb.  It is painful to the touch but not on the inside around his teeth.  He has had no fever and it is not difficult to swallow or open his mouth.  This has not ever happened to him before.     Physical Exam   Triage Vital Signs: ED Triage Vitals  Encounter Vitals Group     BP 06/29/24 2217 (!) 170/75     Girls Systolic BP Percentile --      Girls Diastolic BP Percentile --      Boys Systolic BP Percentile --      Boys Diastolic BP Percentile --      Pulse Rate 06/29/24 2217 86     Resp 06/29/24 2217 17     Temp 06/29/24 2217 98.4 F (36.9 C)     Temp src --      SpO2 06/29/24 2217 93 %     Weight 06/29/24 2217 102.1 kg (225 lb)     Height 06/29/24 2217 1.829 m (6')     Head Circumference --      Peak Flow --      Pain Score 06/29/24 2216 0     Pain Loc --      Pain Education --      Exclude from Growth Chart --     Most recent vital signs: Vitals:   06/29/24 2217 06/30/24 0246  BP: (!) 170/75 (!) 151/78  Pulse: 86 96  Resp: 17 18  Temp: 98.4 F (36.9 C) 98.3 F (36.8 C)  SpO2: 93% 99%    General: Awake,  Somewhat uncomfortable but without obvious distress. Pleasant and conversant. HEENT: Oropharynx is patent, no trismus, no airway obstruction, no evidence of  peritonsillar abscess or other swelling.  Patient has some chronic dental caries and dental deterioration, but no evidence of periapical abscess or swelling to the gums.  Externally he has obvious swelling along the right lower part of his mandible that feels firm and nonmobile and is tender to palpation.  No cervical lymphadenopathy.  No difficulty swallowing or handling his secretions. CV:  Good peripheral perfusion.  Resp:  Normal effort. Speaking easily and comfortably, no accessory muscle usage nor intercostal retractions.   Abd:  No distention.    ED Results / Procedures / Treatments   Labs (all labs ordered are listed, but only abnormal results are displayed) Labs Reviewed  CBC WITH DIFFERENTIAL/PLATELET - Abnormal; Notable for the following components:      Result Value   RBC 3.67 (*)    Hemoglobin 12.7 (*)    HCT 37.3 (*)    MCV 101.6 (*)    MCH 34.6 (*)  All other components within normal limits  BASIC METABOLIC PANEL WITH GFR - Abnormal; Notable for the following components:   Glucose, Bld 125 (*)    Calcium 8.4 (*)    All other components within normal limits       RADIOLOGY See ED course for details   PROCEDURES:  Critical Care performed: No  Procedures    IMPRESSION / MDM / ASSESSMENT AND PLAN / ED COURSE  I reviewed the triage vital signs and the nursing notes.                              Differential diagnosis includes, but is not limited to, facial abscess/cellulitis, odontogenic infection, Ludwig's angina, sialoadenitis  Patient's presentation is most consistent with acute presentation with potential threat to life or bodily function.  Labs/studies ordered: BMP, CBC with differential, CT soft tissue neck  Interventions/Medications given:  Medications  Ampicillin-Sulbactam (UNASYN) 3 g in sodium chloride 0.9 % 100 mL IVPB (0 g Intravenous Stopped 06/30/24 0236)  dexamethasone (DECADRON) injection 10 mg (10 mg Intravenous Given 06/30/24 0049)   iohexol  (OMNIPAQUE ) 300 MG/ML solution 75 mL (75 mLs Intravenous Contrast Given 06/30/24 0127)  ketorolac (TORADOL) 30 MG/ML injection 15 mg (15 mg Intravenous Given 06/30/24 0156)    (Note:  hospital course my include additional interventions and/or labs/studies not listed above.)   Patient is generally well-appearing and his vitals are stable other than hypertension, but the acute swelling in his jaw is worrisome.  I suspect sial adenitis or other salivary gland blockage may be the culprit, but I will evaluate with a CT soft tissue neck to verify he has no evidence of soft tissue infection.  Given the chronic dental caries, I think the possibility of a simmering infection that has spread to his jaw is possible so I will treat empirically with a dose of Unasyn 3 g IV as well as Decadron 10 mg IV and will reassess after the imaging is available.  He agrees with the plan.     Clinical Course as of 06/30/24 0300  Thu Jun 30, 2024  0255 CT Soft Tissue Neck W Contrast I independently viewed and interpreted the patient's CT soft tissue neck and it appears that he has an area of cellulitis but without any drainable fluid collection.  This was confirmed by radiology. [CF]  0255 I reassessed the patient and it seems like the swelling is already gone down and he said he feels better.  We talked about the plan and he is comfortable with the plan for 10 days of outpatient antibiotics with close outpatient follow-up with a dentist.  I gave my usual and customary follow-up recommendations and return precautions and he agrees with the plan. [CF]    Clinical Course User Index [CF] Gordan Huxley, MD     FINAL CLINICAL IMPRESSION(S) / ED DIAGNOSES   Final diagnoses:  Odontogenic infection of jaw  Facial cellulitis     Rx / DC Orders   ED Discharge Orders     None        Note:  This document was prepared using Dragon voice recognition software and may include unintentional dictation errors.    Gordan Huxley, MD 06/30/24 0300

## 2024-06-30 NOTE — Discharge Instructions (Addendum)
 As we discussed, you have an infection in the side of your face caused by your dental cavities.  Please take the full course of antibiotics (10 days worth).  In the meantime, call your dentist or the dentist listed in this paperwork to schedule a follow-up appointment for around the time that the antibiotics are finished.  You will likely need additional dental care to prevent this from happening again.  If you develop new or worsening symptoms that concern you, please return immediately to the emergency department.  We recommend you take Tylenol  1000 mg every 6 hours as well as taking ibuprofen  600 mg 3 times a day with meals.

## 2024-09-28 ENCOUNTER — Ambulatory Visit: Admitting: Dermatology
